# Patient Record
Sex: Male | Born: 1980 | Race: Black or African American | Hispanic: No | Marital: Single | Smoking: Former smoker
Health system: Southern US, Community
[De-identification: ages and names within clinical notes are randomized; demographics above are authoritative.]

## PROBLEM LIST (undated history)

## (undated) DIAGNOSIS — J9383 Other pneumothorax: Secondary | ICD-10-CM

## (undated) DIAGNOSIS — M069 Rheumatoid arthritis, unspecified: Secondary | ICD-10-CM

## (undated) HISTORY — DX: Rheumatoid arthritis, unspecified: M06.9

## (undated) HISTORY — DX: Other pneumothorax: J93.83

---
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
## 2003-10-03 ENCOUNTER — Emergency Department (HOSPITAL_COMMUNITY): Admission: EM | Admit: 2003-10-03 | Discharge: 2003-10-03 | Payer: Self-pay | Admitting: Emergency Medicine

## 2006-10-03 ENCOUNTER — Emergency Department (HOSPITAL_COMMUNITY): Admission: EM | Admit: 2006-10-03 | Discharge: 2006-10-03 | Payer: Self-pay | Admitting: Emergency Medicine

## 2006-11-23 ENCOUNTER — Emergency Department (HOSPITAL_COMMUNITY): Admission: EM | Admit: 2006-11-23 | Discharge: 2006-11-23 | Payer: Self-pay | Admitting: Family Medicine

## 2007-04-02 ENCOUNTER — Emergency Department (HOSPITAL_COMMUNITY): Admission: EM | Admit: 2007-04-02 | Discharge: 2007-04-02 | Payer: Self-pay | Admitting: Emergency Medicine

## 2008-11-28 ENCOUNTER — Emergency Department (HOSPITAL_COMMUNITY): Admission: EM | Admit: 2008-11-28 | Discharge: 2008-11-28 | Payer: Self-pay | Admitting: Family Medicine

## 2009-07-11 ENCOUNTER — Emergency Department (HOSPITAL_COMMUNITY): Admission: EM | Admit: 2009-07-11 | Discharge: 2009-07-11 | Payer: Self-pay | Admitting: Family Medicine

## 2009-10-03 ENCOUNTER — Emergency Department (HOSPITAL_COMMUNITY): Admission: EM | Admit: 2009-10-03 | Discharge: 2009-10-03 | Payer: Self-pay | Admitting: Emergency Medicine

## 2009-10-08 ENCOUNTER — Encounter: Admission: RE | Admit: 2009-10-08 | Discharge: 2009-10-08 | Payer: Self-pay | Admitting: Surgery

## 2009-10-08 ENCOUNTER — Ambulatory Visit: Payer: Self-pay | Admitting: Surgery

## 2010-03-14 ENCOUNTER — Telehealth: Payer: Self-pay | Admitting: *Deleted

## 2010-03-14 NOTE — Telephone Encounter (Signed)
She wants [place] to take her son as a new pt asap as he has chest tightness & hx of collapsed lungs. Sent to ED now. Told her we are not taking new pts at this time. States sh has an appt with a new pcp next month. She wants a pulmonologist. Told her ED could refer him to one if needed. She will get him to ED.Malik KitchenFaustino Congress

## 2010-03-24 ENCOUNTER — Inpatient Hospital Stay (INDEPENDENT_AMBULATORY_CARE_PROVIDER_SITE_OTHER)
Admission: RE | Admit: 2010-03-24 | Discharge: 2010-03-24 | Disposition: A | Discharge status: Home / Self Care | Payer: Medicare Other | Source: Ambulatory Visit | Attending: Family Medicine | Admitting: Family Medicine

## 2010-03-24 DIAGNOSIS — J069 Acute upper respiratory infection, unspecified: Secondary | ICD-10-CM

## 2010-04-04 ENCOUNTER — Institutional Professional Consult (permissible substitution): Payer: Self-pay | Admitting: Internal Medicine

## 2010-04-04 DIAGNOSIS — M069 Rheumatoid arthritis, unspecified: Secondary | ICD-10-CM | POA: Insufficient documentation

## 2010-04-07 ENCOUNTER — Ambulatory Visit (INDEPENDENT_AMBULATORY_CARE_PROVIDER_SITE_OTHER)
Admission: RE | Admit: 2010-04-07 | Discharge: 2010-04-07 | Disposition: A | Discharge status: Home / Self Care | Payer: Medicare Other | Source: Ambulatory Visit | Attending: Internal Medicine | Admitting: Internal Medicine

## 2010-04-07 ENCOUNTER — Encounter: Payer: Self-pay | Admitting: Internal Medicine

## 2010-04-07 ENCOUNTER — Other Ambulatory Visit: Payer: Self-pay | Admitting: Internal Medicine

## 2010-04-07 ENCOUNTER — Institutional Professional Consult (permissible substitution) (INDEPENDENT_AMBULATORY_CARE_PROVIDER_SITE_OTHER): Payer: Medicare Other | Admitting: Internal Medicine

## 2010-04-07 DIAGNOSIS — J449 Chronic obstructive pulmonary disease, unspecified: Secondary | ICD-10-CM

## 2010-04-07 DIAGNOSIS — J4489 Other specified chronic obstructive pulmonary disease: Secondary | ICD-10-CM | POA: Insufficient documentation

## 2010-04-07 DIAGNOSIS — M069 Rheumatoid arthritis, unspecified: Secondary | ICD-10-CM

## 2010-04-07 DIAGNOSIS — J9383 Other pneumothorax: Secondary | ICD-10-CM

## 2010-04-17 NOTE — Assessment & Plan Note (Signed)
Summary: Pulmonary/ new pt eval   Visit Type:  Initial Consult Copy to:  Dr. Azzie Roup Primary Provider/Referring Provider:  none  CC:  DOE.  History of Present Illness: 45 yobm  with asthma as child  never outgrew the need for rescue inahler quit smoking x 2010 with JRA since age 30  then new sob since sept 2012 with R PTX > never required chest tube   April 07, 2010  1st pulmonary office eval for doe x Sept 2012  with recurrent ptx never required a chest tube or admit but felt it happen again 6 weeks prior to ov  and by day of ov feel better, using IS only.  no pulmonary meds, feels his IS  "machine" is all he needs.  doe x > fast walk or up hills. no noct exac or early am flare of symptoms, minimal dry cough.  Presently Pt denies any significant oongoing  sore throat, dysphagia, itching, sneezing,  nasal congestion or excess secretions,  fever, chills, sweats, unintended wt loss, pleuritic or exertional cp, hempoptysis, change in activity tolerance  orthopnea pnd or leg swelling Pt also denies any obvious fluctuation in symptoms with weather or environmental change or other alleviating or aggravating factors.       Current Medications (verified): 1)  Hydrocodone-Acetaminophen 10-650 Mg Tabs (Hydrocodone-Acetaminophen) .Marland Kitchen.. 1 Three Times A Day As Needed For Pain 2)  Methotrexate 2.5 Mg Tabs (Methotrexate Sodium) .... 3 Tablets Wkly 3)  Meloxicam 15 Mg Tabs (Meloxicam) .Marland Kitchen.. 1 Once Daily 4)  Plaquenil 200 Mg Tabs (Hydroxychloroquine Sulfate) .Marland Kitchen.. 1 Once Daily  Allergies (verified): 1)  ! Sulfa  Past History:  Past Medical History: Rheumatoid Arthritis Recurrent PTX onset Sept 2010     - Alpha one AT screen April 07, 2010 >>  Family History: Negative for respiratory diseases or atopy   Social History: Single Former smoker. Quit in 2010.  Smoked cigars occ for a few yrs. No ETOH Lives alone Works PT at a [employer]  Review of Systems       The patient complains of  shortness of breath with activity, chest pain, loss of appetite, and sore throat.  The patient denies shortness of breath at rest, productive cough, non-productive cough, coughing up blood, irregular heartbeats, acid heartburn, indigestion, weight change, abdominal pain, difficulty swallowing, tooth/dental problems, headaches, nasal congestion/difficulty breathing through nose, sneezing, itching, ear ache, anxiety, depression, hand/feet swelling, joint stiffness or pain, rash, change in color of mucus, and fever.    Vital Signs:  Patient profile:   30 year old male Height:      70 inches Weight:      129 pounds BMI:     18.58 O2 Sat:      98 % on Room air Temp:     97.4 degrees F oral Pulse rate:   88 / minute BP sitting:   110 / 70  (left arm)  Vitals Entered By: Vernie Murders (April 07, 2010 10:51 AM)  O2 Flow:  Room air  Physical Exam  Additional Exam:  thin amb bm nad with classic ra changes both hands wt 129 April 07, 2010  HEENT mild turbinate edema.  Oropharynx no thrush or excess pnd or cobblestoning.  No JVD or cervical adenopathy. Mild accessory muscle hypertrophy. Trachea midline, nl thryroid. Chest was hyperinflated by percussion with diminished breath sounds and moderate increased exp time without wheeze. Hoover sign positive at mid inspiration. Regular rate and rhythm without murmur gallop or rub or  increase P2 or edema.  Abd: no hsm, nl excursion. Ext warm without cyanosis or clubbing.     CXR  Procedure date:  04/07/2010  Findings:       Comparison: Chest radiograph 10/08/2009, 10/03/2009 and 11/28/2008.  Findings: There is no pneumothorax.  Biapical scarring is stable. Lung volumes are normal.  The lungs are clear.  Heart, mediastinal, and hilar contours are stable.  No acute bony abnormality.  IMPRESSION: Stable chest radiograph.  No acute finding  Impression & Recommendations:  Problem # 1:  COPD UNSPECIFIED (ICD-496)   DDX of  difficult airways  managment all start with A and  include Adherence, Ace Inhibitors, Acid Reflux, Active Sinus Disease, Alpha 1 Antitripsin deficiency, Anxiety masquerading as Airways dz,  ABPA,  allergy(esp in young), Aspiration (esp in elderly), Adverse effects of DPI,  Active smokers, plus two Bs  = Bronchiectasis and Beta blocker use..and one C= CHF    Alpha one needs to be excluded here  Anixety always dx of exclusion  Needs pft's for baseline  Problem # 2:  Hx of OTHER PNEUMOTHORAX (ICD-512.89)   discussed risk of recurrence/ tension.  lives nearby, no barometric pressure swing issues in terms of occupation or hobbies  Problem # 3:  RHEUMATOID ARTHRITIS (ICD-714.0) Risk of  RA related BO,  pft's will be impt here as well as avoiding all cig exposures  Medications Added to Medication List This Visit: 1)  Hydrocodone-acetaminophen 10-650 Mg Tabs (Hydrocodone-acetaminophen) .Marland Kitchen.. 1 three times a day as needed for pain 2)  Methotrexate 2.5 Mg Tabs (Methotrexate sodium) .... 3 tablets wkly 3)  Meloxicam 15 Mg Tabs (Meloxicam) .Marland Kitchen.. 1 once daily 4)  Plaquenil 200 Mg Tabs (Hydroxychloroquine sulfate) .Marland Kitchen.. 1 once daily  Other Orders: T-2 View CXR (71020TC) New Patient Level V (14782)  Patient Instructions: 1)  Please schedule a follow-up appointment in 6 weeks, sooner if needed with pft's

## 2010-04-26 ENCOUNTER — Emergency Department (HOSPITAL_COMMUNITY)
Admission: EM | Admit: 2010-04-26 | Discharge: 2010-04-26 | Disposition: A | Discharge status: Home / Self Care | Payer: Medicare Other | Attending: Emergency Medicine | Admitting: Emergency Medicine

## 2010-04-26 ENCOUNTER — Emergency Department (HOSPITAL_COMMUNITY): Payer: Medicare Other

## 2010-04-26 DIAGNOSIS — J45909 Unspecified asthma, uncomplicated: Secondary | ICD-10-CM | POA: Insufficient documentation

## 2010-04-26 DIAGNOSIS — R079 Chest pain, unspecified: Secondary | ICD-10-CM | POA: Insufficient documentation

## 2010-04-26 DIAGNOSIS — R0602 Shortness of breath: Secondary | ICD-10-CM | POA: Insufficient documentation

## 2010-04-26 DIAGNOSIS — M083 Juvenile rheumatoid polyarthritis (seronegative): Secondary | ICD-10-CM | POA: Insufficient documentation

## 2010-04-26 DIAGNOSIS — S270XXA Traumatic pneumothorax, initial encounter: Secondary | ICD-10-CM | POA: Insufficient documentation

## 2010-04-28 ENCOUNTER — Emergency Department (HOSPITAL_COMMUNITY): Payer: Medicare Other

## 2010-04-28 ENCOUNTER — Emergency Department (HOSPITAL_COMMUNITY)
Admission: EM | Admit: 2010-04-28 | Discharge: 2010-04-28 | Disposition: A | Discharge status: Home / Self Care | Payer: Medicare Other | Attending: Emergency Medicine | Admitting: Emergency Medicine

## 2010-04-28 DIAGNOSIS — M083 Juvenile rheumatoid polyarthritis (seronegative): Secondary | ICD-10-CM | POA: Insufficient documentation

## 2010-04-28 DIAGNOSIS — Z09 Encounter for follow-up examination after completed treatment for conditions other than malignant neoplasm: Secondary | ICD-10-CM | POA: Insufficient documentation

## 2010-04-28 DIAGNOSIS — J9383 Other pneumothorax: Secondary | ICD-10-CM | POA: Insufficient documentation

## 2010-04-28 DIAGNOSIS — Z79899 Other long term (current) drug therapy: Secondary | ICD-10-CM | POA: Insufficient documentation

## 2010-04-28 DIAGNOSIS — J45909 Unspecified asthma, uncomplicated: Secondary | ICD-10-CM | POA: Insufficient documentation

## 2010-04-29 ENCOUNTER — Other Ambulatory Visit: Payer: Self-pay | Admitting: Cardiothoracic Surgery

## 2010-04-29 DIAGNOSIS — J930 Spontaneous tension pneumothorax: Secondary | ICD-10-CM

## 2010-04-30 ENCOUNTER — Ambulatory Visit: Payer: Medicare Other | Admitting: Cardiothoracic Surgery

## 2010-05-02 ENCOUNTER — Other Ambulatory Visit: Payer: Self-pay | Admitting: Cardiothoracic Surgery

## 2010-05-02 DIAGNOSIS — J930 Spontaneous tension pneumothorax: Secondary | ICD-10-CM

## 2010-05-05 ENCOUNTER — Ambulatory Visit
Admission: RE | Admit: 2010-05-05 | Discharge: 2010-05-05 | Disposition: A | Discharge status: Home / Self Care | Payer: Medicare Other | Source: Ambulatory Visit | Attending: Cardiothoracic Surgery | Admitting: Cardiothoracic Surgery

## 2010-05-05 ENCOUNTER — Ambulatory Visit (INDEPENDENT_AMBULATORY_CARE_PROVIDER_SITE_OTHER): Payer: Medicare Other

## 2010-05-05 DIAGNOSIS — J930 Spontaneous tension pneumothorax: Secondary | ICD-10-CM

## 2010-05-16 ENCOUNTER — Encounter: Payer: Self-pay | Admitting: Internal Medicine

## 2010-05-19 ENCOUNTER — Ambulatory Visit (INDEPENDENT_AMBULATORY_CARE_PROVIDER_SITE_OTHER): Payer: Medicare Other | Admitting: Internal Medicine

## 2010-05-19 ENCOUNTER — Encounter: Payer: Self-pay | Admitting: Internal Medicine

## 2010-05-19 ENCOUNTER — Telehealth: Payer: Self-pay | Admitting: Internal Medicine

## 2010-05-19 VITALS — BP 110/70 | HR 88 | Temp 98.4°F | Ht 70.0 in | Wt 139.0 lb

## 2010-05-19 DIAGNOSIS — J9383 Other pneumothorax: Secondary | ICD-10-CM

## 2010-05-19 DIAGNOSIS — J45909 Unspecified asthma, uncomplicated: Secondary | ICD-10-CM | POA: Insufficient documentation

## 2010-05-19 DIAGNOSIS — J449 Chronic obstructive pulmonary disease, unspecified: Secondary | ICD-10-CM

## 2010-05-19 LAB — PULMONARY FUNCTION TEST

## 2010-05-19 MED ORDER — MOMETASONE FURO-FORMOTEROL FUM 100-5 MCG/ACT IN AERO: INHALATION_SPRAY | RESPIRATORY_TRACT | Status: DC

## 2010-05-19 NOTE — Assessment & Plan Note (Signed)
See rx for ? Asthma to reduce exac

## 2010-05-19 NOTE — Progress Notes (Signed)
PFT done today. 

## 2010-05-19 NOTE — Progress Notes (Signed)
  Subjective:    Patient ID: Malik Barker, male    DOB: 1980-08-15, 30 y.o.   MRN: [id number]  HPI  Primary Malik Barker  29 yobm quit smoking 2001  with asthma as child  never outgrew the need for rescue inahler quit smoking x 2010 with JRA since age 89  then new sob since sept 2011 with R PTX > never required chest tube   April 07, 2010  1st pulmonary office eval for doe x Sept 2012  with recurrent ptx never required a chest tube or admit but felt it happen again 6 weeks prior to ov  and by day of ov feel better, using IS only.  no pulmonary meds, feels his IS  "machine" is all he needs.  doe x > fast walk or up hills. no noct exac or early am flare of symptoms, minimal dry cough.   05/19/2010 ov/ Malik Barker  4th episode of ptx over Easter Saturday eval by Malik Barker and no need for Chest tube, back to nl.   Uses saba w/in past week feels better p rx but note Sleeping ok without nocturnal  or early am exac of resp c/o's or need for noct saba.  No purulent sputum or sob  Pt denies any significant sore throat, dysphagia, itching, sneezing,  nasal congestion or excess/ purulent secretions,  fever, chills, sweats, unintended wt loss, pleuritic or exertional cp, hempoptysis, orthopnea pnd or leg swelling.    Also denies any obvious fluctuation of symptoms with weather or environmental changes or other aggravating or alleviating factors.         Allergies  1)  ! Sulfa    Past Medical History: Rheumatoid Arthritis Recurrent PTX onset Sept 2010     - Alpha one AT screen April 07, 2010 >> neg     - PFT's 05/19/2010 nl x mid flows, subj better on saba > dulera trial @ 100 2bid initiated   Family History: Negative for respiratory diseases or atopy   Social History: Single Former smoker. Quit in 2010.  Smoked cigars occ for a few yrs. No ETOH Lives alone Works PT at a [employer]              Review of Systems     Objective:   Physical Exam    thin amb bm nad with classic ra  changes both hands wt 129 April 07, 2010  >  139 05/19/2010  HEENT mild turbinate edema.  Oropharynx no thrush or excess pnd or cobblestoning.  No JVD or cervical adenopathy. Mild accessory muscle hypertrophy. Trachea midline, nl thryroid. Chest was hyperinflated by percussion with diminished breath sounds and moderate increased exp time without wheeze. Hoover sign positive at mid inspiration. Regular rate and rhythm without murmur gallop or rub or increase P2 or edema.  Abd: no hsm, nl excursion. Ext warm without cyanosis or clubbing.       Assessment & Plan:

## 2010-05-19 NOTE — Assessment & Plan Note (Addendum)
Clinical hx suggests asthma with nl baseline pft's but this may be contributing to recurrent ptx so will go ahead and try dulera to see if it eliminates all symptoms including tendency to recurrent ptx  Discussed in detail all the  indications, usual  risks and alternatives  relative to the benefits with patient who agrees to proceed with  Trial of dulera with purpose to reduce use of saba and recurrence rate for ptx   The proper method of use, as well as anticipated side effects, of this metered-dose inhaler are discussed and demonstrated to the patient. Pt improved to 75% with coaching

## 2010-05-19 NOTE — Telephone Encounter (Signed)
ATC pt to inform Alpha 1 screen was neg.  NA and msg states that VM box is full so unable to leave a msg. WCB.

## 2010-05-19 NOTE — Patient Instructions (Signed)
Start dulera 100 Take 2 puffs first thing in am   Call Dr Zenaida Niece Tright if bad chest pain recurs or go to ER.  Please schedule a follow up office visit in 8  weeks, sooner if needed

## 2010-05-30 ENCOUNTER — Encounter: Payer: Self-pay | Admitting: Internal Medicine

## 2010-06-03 NOTE — Telephone Encounter (Signed)
Spoke with pt and notified of results per Dr. Wert. Pt verbalized understanding and denied any questions. 

## 2010-06-03 NOTE — Consult Note (Signed)
NEW PATIENT CONSULTATION   [place], [facility]  DOB:  1980/10/04                                        October 08, 2009  CHART #:  [id number]   HISTORY:  The patient is a 30 year old smoker who was seen in the  emergency room at [facility] complaining of right-sided chest pain.  He  was diagnosed with a small right apical spontaneous pneumothorax.  A  followup chest x-ray was performed a few hours later and it was  unchanged.  The patient was sent home with followup in our office for  further consultation.   PAST MEDICAL HISTORY:  Significant only for history of asthma.  He has  had no prior surgery.   SOCIAL HISTORY:  He is unemployed and single.  He said he quit smoking,  it is not clear when that was.  He denies alcohol or drug use.   FAMILY HISTORY:  Negative.   REVIEW OF SYSTEMS:  Noted on the patient information sheet in the chart.  He said he still gets a little bit of right-sided chest discomfort that  is improving.  He denies any cough or sputum production.   PHYSICAL EXAMINATION:  Vital Signs:  Blood pressure is 115/70, pulse 82  and regular, respiratory rate is 16, unlabored.  Oxygen saturation on  room air is 99%.  General:  He looks well.  Lungs:  Clear.  Cardiac:  Regular rate and rhythm with normal heart sounds.   Followup chest x-ray today shows clear lung fields and no pleural  effusions.  There is no pneumothorax.   IMPRESSION:  The patient has had a first episode of spontaneous right  pneumothorax that is completely resolved on chest x-ray.  He still has a  little bit of chest discomfort but this is improving.  I do not think he  requires any further treatment.  I advised him against smoking and also  advised him against scuba diving or flying in an airplane over the next  3 months.  I told him hey really should not be doing any scuba diving in  the future, and he said that he has not planning on doing that.  I  discussed the  pathophysiology of spontaneous pneumothorax and told him  there is about 20% risk of this may recur over the next 1-2 years.  He  will contact at our office if he has recurrent symptoms.  I also advised  him against doing any activity that requires heavy straining such as  weight lifting or playing foreign musical instruments.  He said he  understands and will contact us if he develops any recurrent symptoms.   Evelene Croon, M.D.  Electronically Signed   BB/MEDQ  D:  10/08/2009  T:  10/09/2009  Job:  [id number]

## 2010-07-29 NOTE — Assessment & Plan Note (Signed)
OFFICE VISIT  Malik Barker, Malik Barker [facility] DOB:  24-Nov-1980                                        May 05, 2010 CHART #:  [id number]  HISTORY OF PRESENT ILLNESS:  Malik Barker is a 30 year old male who is known to our office from a previous visit in September 2011.  He was seen at that time by Dr. Laneta Simmers for followup of small right apical spontaneous pneumothorax which had been seen in the emergency department.  Upon his visit, the pneumothorax had resolved and Dr. Laneta Simmers did not feel that he would require any further treatment at this time.  Since then, he has remained relatively stable until approximately 3 weeks ago at which time he presented to the emergency department at [facility] complaining of acute onset dyspnea and right-sided chest discomfort.  He was noted to have again a 10% pneumothorax which was monitored.  He returned after 48 hours for further observation and his x-ray had remained stable.  He is asked to follow up with our office and he comes in today for recheck. He states that his shortness of breath has completely resolved, however, he continues to have significant discomfort particularly in his right back around his shoulder blade.  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 Phylliss Bob and currently is being followed by Dr. Dareen Piano.  He was most recently seen just prior to his being seen in the emergency department and he takes meloxicam and methotrexate regularly.  Additionally, they recently started him on a 10-week course of prednisone for his RA.  He states that the anterior chest discomfort is improved, but the pain is worse with moving around or lifting his arm.  PHYSICAL EXAMINATION:  VITAL SIGNS:  Blood pressure 127/73, respirations 80 and unlabored, and O2 sat 100% on room air. HEART:  Regular rate and rhythm.  No murmurs, rubs, or gallops. LUNGS:  Clear to auscultation. MUSCULOSKELETAL:  He has  significant muscular tenderness along the right scapula and no limitation of range of motion.  On chest x-ray, his right pneumothorax has almost completely resolved with only a tiny apical residual pneumothorax.  ASSESSMENT/PLAN:  Malik Barker returns today for followup of a spontaneous 10% right pneumothorax which was seen in the emergency department.  At this point, his pneumothorax is resolved.  He will not need further treatment from this standpoint.  It is unclear the etiology of the discomfort he is experiencing.  I am sure there is probably a pleuritic component as well as some inflammatory component.  He already takes an antiinflammatory as well as a steroid on a regular basis.  He does appear to have significant muscle tenderness and spasm over this area, possibly from guarding and limiting his movement.  I have given him a prescription for Robaxin to take for 1 week and asked him to apply heat to the area to see if we can decrease his muscle spasm.  We will not need to see him back on a regular basis for followup, but he should follow up in the next week and half as already scheduled with Dr. Durenda Age for further evaluation of his RA.  We will be happy to see him as needed in the future if he experiences a recurrence.  I encouraged him to continue limitation of very strenuous activity.  We will  see him back if he has any further problems which may be related to it.  Malik Barker, P.A.  GC/MEDQ  D:  05/05/2010  T:  05/06/2010  Job:  [id number]  cc:   Lurena Nida, MD [facility] Office

## 2010-10-13 LAB — POCT RAPID STREP A: Streptococcus, Group A Screen (Direct): NEGATIVE

## 2010-10-31 LAB — POCT RAPID STREP A: Streptococcus, Group A Screen (Direct): NEGATIVE

## 2010-12-09 ENCOUNTER — Emergency Department (INDEPENDENT_AMBULATORY_CARE_PROVIDER_SITE_OTHER)
Admission: EM | Admit: 2010-12-09 | Discharge: 2010-12-09 | Disposition: A | Discharge status: Home / Self Care | Payer: Medicare Other | Source: Home / Self Care | Attending: Emergency Medicine | Admitting: Emergency Medicine

## 2010-12-09 ENCOUNTER — Encounter (HOSPITAL_COMMUNITY): Payer: Self-pay | Admitting: *Deleted

## 2010-12-09 ENCOUNTER — Emergency Department (INDEPENDENT_AMBULATORY_CARE_PROVIDER_SITE_OTHER): Payer: Medicare Other

## 2010-12-09 DIAGNOSIS — J939 Pneumothorax, unspecified: Secondary | ICD-10-CM

## 2010-12-09 DIAGNOSIS — J9383 Other pneumothorax: Secondary | ICD-10-CM

## 2010-12-09 NOTE — ED Notes (Signed)
Pt reports h/o lung problems and has been seeing Dr Sherene Sires for pulmonary work-ups  4 days ago he felt pain in the right anterior chest with slight SOB

## 2010-12-09 NOTE — ED Provider Notes (Addendum)
History     CSN: [id number] Arrival date & time: 12/09/2010 11:30 AM   First MD Initiated Contact with Patient 12/09/10 [phone]      Chief Complaint  Patient presents with  . URI    (Consider location/radiation/quality/duration/timing/severity/associated sxs/prior treatment) HPI Comments: 4 days ago, felt severe pain on my R side "felt like my lung collapsed",, No i did not sick medical attention" "they never know what is wrong with me?!!  What is this [facility] your talking about, i do have my nebulizer machine at home, "felt SOB .cough vomited x 1". It does hurt still (points to R anterior chest region)   (Patient comfortably sitting-full sentences found in room supine-resting with cel phone in hand) No respiratory distress.  Patient is a 30 y.o. male presenting with URI. The history is provided by the patient and a relative.  URI The primary symptoms include cough, nausea and vomiting. Primary symptoms do not include fever, fatigue or rash. The current episode started 3 to 5 days ago. This is a chronic problem.    Past Medical History  Diagnosis Date  . Rheumatoid arthritis   . Recurrent spontaneous pneumothorax     Past Surgical History  Procedure Date  . Total hip arthroplasty 2001    right    No family history on file.  History  Substance Use Topics  . Smoking status: Former Smoker -- 1.0 packs/day for 1 years    Types: Cigars    Quit date: 01/20/1999  . Smokeless tobacco: Never Used  . Alcohol Use: Yes     rare      Review of Systems  Constitutional: Negative for fever and fatigue.  Respiratory: Positive for cough, chest tightness and shortness of breath.   Gastrointestinal: Positive for nausea and vomiting.  Skin: Negative for rash.  Neurological: Negative for dizziness.    Allergies  Sulfonamide derivatives  Home Medications   Current Outpatient Rx  Name Route Sig Dispense Refill  . HYDROCODONE-ACETAMINOPHEN 10-650 MG PO TABS Oral Take 1 tablet by  mouth every 8 (eight) hours as needed.      Marland Kitchen HYDROXYCHLOROQUINE SULFATE 200 MG PO TABS Oral Take 200 mg by mouth daily.      . MELOXICAM 15 MG PO TABS Oral Take 15 mg by mouth daily.      Marland Kitchen PROAIR HFA 108 (90 BASE) MCG/ACT IN AERS Oral Take 2 sprays by mouth Every 6 hours as needed.    . METHOTREXATE 2.5 MG PO TABS  3 tablets once daily     . MOMETASONE FURO-FORMOTEROL FUM 100-5 MCG/ACT IN AERO  Take 2 puffs first thing in am      BP 130/80  Pulse 93  Temp(Src) 97.9 F (36.6 C) (Oral)  Resp 16  SpO2 100%  Physical Exam  Nursing note and vitals reviewed. Constitutional: He appears well-developed and well-nourished. No distress.  Pulmonary/Chest: Effort normal. No respiratory distress. He has decreased breath sounds in the right upper field. He has no wheezes. He has no rales. He exhibits no tenderness.  Abdominal: Soft.  Skin: Skin is intact. There is erythema.    ED Course  Procedures (including critical care time)  Labs Reviewed - No data to display No results found.   No diagnosis found.    MDM  Ongoing respiratory-sx-s including recurrent pneumothorax- pulmonologist and PCP working on symptomatic- treatments- suspect poor compliance and treatment adherence- Discussed case with pulmonologist on call, we opted to admit the patient for observation. Patient had things  to do at home opted to sign AMA. We advice him that is pneumothorax could further advance putting his life at risk        Jimmie Molly, MD 12/09/10 1230  Jimmie Molly, MD 12/09/10 1230  Jimmie Molly, MD 12/09/10 [phone]

## 2010-12-15 ENCOUNTER — Inpatient Hospital Stay: Payer: Medicare Other | Admitting: Adult Health

## 2010-12-22 ENCOUNTER — Inpatient Hospital Stay: Payer: Medicare Other | Admitting: Adult Health

## 2011-10-12 ENCOUNTER — Observation Stay (HOSPITAL_COMMUNITY): Payer: Medicare Other

## 2011-10-12 ENCOUNTER — Emergency Department (HOSPITAL_COMMUNITY): Payer: Medicare Other

## 2011-10-12 ENCOUNTER — Observation Stay (HOSPITAL_COMMUNITY)
Admission: EM | Admit: 2011-10-12 | Discharge: 2011-10-13 | Discharge status: Against Medical Advice | Payer: Medicare Other | Attending: Pulmonary Disease | Admitting: Pulmonary Disease

## 2011-10-12 ENCOUNTER — Encounter (HOSPITAL_COMMUNITY): Payer: Self-pay | Admitting: Family Medicine

## 2011-10-12 DIAGNOSIS — R079 Chest pain, unspecified: Secondary | ICD-10-CM | POA: Insufficient documentation

## 2011-10-12 DIAGNOSIS — J9383 Other pneumothorax: Principal | ICD-10-CM | POA: Insufficient documentation

## 2011-10-12 DIAGNOSIS — M069 Rheumatoid arthritis, unspecified: Secondary | ICD-10-CM | POA: Diagnosis present

## 2011-10-12 DIAGNOSIS — J439 Emphysema, unspecified: Secondary | ICD-10-CM | POA: Diagnosis present

## 2011-10-12 DIAGNOSIS — J939 Pneumothorax, unspecified: Secondary | ICD-10-CM

## 2011-10-12 DIAGNOSIS — R0602 Shortness of breath: Secondary | ICD-10-CM | POA: Insufficient documentation

## 2011-10-12 DIAGNOSIS — M083 Juvenile rheumatoid polyarthritis (seronegative): Secondary | ICD-10-CM | POA: Insufficient documentation

## 2011-10-12 MED ORDER — ZOLPIDEM TARTRATE 5 MG PO TABS
10.0000 mg | ORAL_TABLET | Freq: Once | ORAL | Status: DC
  Filled 2011-10-12: qty 2

## 2011-10-12 MED ORDER — ALBUTEROL SULFATE HFA 108 (90 BASE) MCG/ACT IN AERS: 2.0000 | INHALATION_SPRAY | RESPIRATORY_TRACT | Status: DC | PRN

## 2011-10-12 MED ORDER — HYDROCODONE-ACETAMINOPHEN 10-325 MG PO TABS
1.0000 | ORAL_TABLET | Freq: Four times a day (QID) | ORAL | Status: DC | PRN
  Administered 2011-10-12: 1 via ORAL
  Filled 2011-10-12: qty 2

## 2011-10-12 NOTE — ED Provider Notes (Signed)
Pt seen with PA Patient has recurrent right sided PTX 15% and does not require chest tube I spoke to pulmonary who has managed this gentleman previously and will admit for him for OBS Pt is in no distress at this time   Joya Gaskins, MD 10/12/11 1344

## 2011-10-12 NOTE — ED Provider Notes (Signed)
History     CSN: [id number]  Arrival date & time 10/12/11  1150   First MD Initiated Contact with Patient 10/12/11 1215      Chief Complaint  Patient presents with  . Shortness of Breath    (Consider location/radiation/quality/duration/timing/severity/associated sxs/prior treatment) HPI Comments: Patient presents with acute onset of SOB and chest pain that started this morning when he woke up. The chest pain is sharp and located on the right side of his chest. The pain is moderate to severe and made worse with inspiration. He reports having a pneumothorax in the past, which felt just like this. He denies NVD, abdominal pain, headache.     Patient is a 31 y.o. male presenting with shortness of breath.  Shortness of Breath  Associated symptoms include chest pain and shortness of breath.    Past Medical History  Diagnosis Date  . Rheumatoid arthritis   . Recurrent spontaneous pneumothorax     Past Surgical History  Procedure Date  . Total hip arthroplasty 2001    right    History reviewed. No pertinent family history.  History  Substance Use Topics  . Smoking status: Former Smoker -- 1.0 packs/day for 1 years    Types: Cigars    Quit date: 01/20/1999  . Smokeless tobacco: Never Used  . Alcohol Use: Yes     rare      Review of Systems  Respiratory: Positive for shortness of breath.   Cardiovascular: Positive for chest pain.  All other systems reviewed and are negative.    Allergies  Sulfonamide derivatives  Home Medications   Current Outpatient Rx  Name Route Sig Dispense Refill  . HYDROCODONE-ACETAMINOPHEN 10-650 MG PO TABS Oral Take 1 tablet by mouth every 8 (eight) hours as needed.      Marland Kitchen HYDROXYCHLOROQUINE SULFATE 200 MG PO TABS Oral Take 200 mg by mouth daily.      . MELOXICAM 15 MG PO TABS Oral Take 15 mg by mouth daily.      Marland Kitchen METHOTREXATE 2.5 MG PO TABS  3 tablets once daily     . PROAIR HFA 108 (90 BASE) MCG/ACT IN AERS Oral Take 2 sprays by  mouth Every 6 hours as needed. Shortness of breath      BP 111/63  Pulse 66  Temp 98.1 F (36.7 C) (Oral)  Resp 19  SpO2 100%  Physical Exam  Nursing note and vitals reviewed. Constitutional: He is oriented to person, place, and time. He appears well-developed and well-nourished. No distress.  HENT:  Head: Normocephalic and atraumatic.  Mouth/Throat: No oropharyngeal exudate.  Eyes: Conjunctivae normal and EOM are normal. Pupils are equal, round, and reactive to light. No scleral icterus.  Neck: Normal range of motion. Neck supple.  Cardiovascular: Normal rate and regular rhythm.  Exam reveals no gallop and no friction rub.   No murmur heard. Pulmonary/Chest: Effort normal and breath sounds normal. No respiratory distress. He has no wheezes. He has no rales. He exhibits no tenderness.  Musculoskeletal: Normal range of motion.  Neurological: He is alert and oriented to person, place, and time. No cranial nerve deficit. Coordination normal.  Skin: Skin is warm and dry. He is not diaphoretic.  Psychiatric: He has a normal mood and affect. His behavior is normal.    ED Course  Procedures (including critical care time)  Labs Reviewed - No data to display Dg Chest 2 View  10/12/2011  *RADIOLOGY REPORT*  Clinical Data: Shortness of breath, chest pain.  Pneumothorax.  CHEST - 2 VIEW  Comparison: 12/09/2010  Findings: There is a right-sided pneumothorax, likely approximately 15%, similar to prior study.  Mediastinal structures are midline. Mild hyperinflation of the lungs.  Heart is normal size.  No effusions or acute bony abnormality.  IMPRESSION: Recurrent right pneumothorax, approximately 15%, similar to prior study.   Original Report Authenticated By: Cyndie Chime, M.D.      1. Pneumothorax       MDM  2:12 PM Patient will be admitted for pneumothorax. He is currently stable and in no pain.         Emilia Beck, PA-C 10/12/11 1525

## 2011-10-12 NOTE — ED Notes (Addendum)
Spoke with Merry Proud, NP to clarify order for CXR.   She states that pt needs stat CXR d/t concerns that pneumothorax has gotten larger.  Called x-ray to inform that pt does need a CXR.  Informed pt of need for a new CXR.  Pt does not want to be here and wants to know what he has to do to leave.  Asked pt if he would allow CXR to be done so that it can be confirmed if pneumothorax is larger.  Pt agrees to this.

## 2011-10-12 NOTE — ED Notes (Signed)
Pt refusing any IV at this time. Dr. Tyson Alias notified, stated as long as pt understands risks (this RN described to pt risks) pt may go upstairs. RN tina notified.

## 2011-10-12 NOTE — ED Notes (Signed)
Per pt hx of pneumothorax. sts he woke up this am with SOB and pain on he right side of his chest. sts similar to the way he felt prior.

## 2011-10-12 NOTE — ED Notes (Signed)
Pt to XR

## 2011-10-12 NOTE — ED Notes (Signed)
Patient transported to X-ray 

## 2011-10-12 NOTE — H&P (Signed)
Name: Malik Barker MRN: [id number] DOB: [birth year]    LOS: 0  [facility]   History of Present Illness: 31 y/o AAM, continued intermittent smoker, with PMH of JRA and recurrent PTX presented to [facility] ED on 9/23 after waking with sudden onset severe right sided chest pain that "felt like the other times".  ED evaluation demonstrated CXR with approx 15% PTX.  Denied other associated symptoms to include cough, sinus drainage, fevers, chills.  PCCM called to evaluate.     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  Normal PFT's in 2012 and negative A1 antitrypsin.       Tests / Events: 9/23 CT Chest>>>   Past Medical History  Diagnosis Date  . Rheumatoid arthritis   . Recurrent spontaneous pneumothorax     Past Surgical History  Procedure Date  . Total hip arthroplasty 2001    right    Prior to Admission medications   Medication Sig Start Date End Date Taking? Authorizing Provider  HYDROcodone-acetaminophen (LORCET) 10-650 MG per tablet Take 1 tablet by mouth every 8 (eight) hours as needed.     Yes Historical Provider, MD  hydroxychloroquine (PLAQUENIL) 200 MG tablet Take 200 mg by mouth daily.     Yes Historical Provider, MD  meloxicam (MOBIC) 15 MG tablet Take 15 mg by mouth daily.     Yes Historical Provider, MD  methotrexate (RHEUMATREX) 2.5 MG tablet 3 tablets once daily    Yes Historical Provider, MD  PROAIR HFA 108 (90 BASE) MCG/ACT inhaler Take 2 sprays by mouth Every 6 hours as needed. Shortness of breath 03/20/10  Yes Historical Provider, MD    Allergies Allergies  Allergen Reactions  . Sulfonamide Derivatives     REACTION: unknown    Family History History reviewed. No pertinent family history.  Social History  reports that he quit smoking about 12 years ago. His smoking use included Cigars. He has never used smokeless tobacco. He  reports that he drinks alcohol. He reports that he does not use illicit drugs.  Review Of Systems:   Gen: Denies fever, chills, weight change, fatigue, night sweats HEENT: Denies blurred vision, double vision, hearing loss, tinnitus, sinus congestion, rhinorrhea, sore throat, neck stiffness, dysphagia PULM: Denies cough, sputum production, hemoptysis, wheezing.  SEE HPI CV: Denies chest pain, edema, orthopnea, paroxysmal nocturnal dyspnea, palpitations GI: Denies abdominal pain, nausea, vomiting, diarrhea, hematochezia, melena, constipation, change in bowel habits GU: Denies dysuria, hematuria, polyuria, oliguria, urethral discharge Endocrine: Denies hot or cold intolerance, polyuria, polyphagia or appetite change Derm: Denies rash, dry skin, scaling or peeling skin change Heme: Denies easy bruising, bleeding, bleeding gums Neuro: Denies headache, numbness, weakness, slurred speech, loss of memory or consciousness  Vital Signs: Temp:  [98.1 F (36.7 C)] 98.1 F (36.7 C) (09/23 1154) Pulse Rate:  [66-78] 66  (09/23 1330) Resp:  [16-19] 19  (09/23 1330) BP: (111-126)/(63-90) 111/63 mmHg (09/23 1330) SpO2:  [100 %] 100 % (09/23 1330)    Physical Examination: General: wdwn adult male in NAD Neuro:AAOx4, speech clear, MAE CV: s1s2 rrr, no m/r/g PULM: resp's even/non-labored,lungs bilaterally clear on 100% NRB (for PTX, not hypoxic) GI: flat, soft, bsx4 active Extremities: warm/dry, no edema  Labs    CBC No results found for this basename: HGB:3,HCT:3,WBC:3,PLT:3 in the last 168 hours   BMET No results found for this basename: NA:5,K:2,CL:5,CO2:5,GLUCOSE:5,BUN:5,CREATININE:5,CALCIUM:5,MG:5,PHOS:5 in the  last 168 hours  No results found for this basename: INR:5 in the last 168 hours  No results found for this basename: PHART:5,PCO2:5,PCO2ART:5,PO2:5,PO2ART:5,HCO3:5,TCO2:5,O2SAT:5 in the last 168 hours   Radiology: 9/23 CXR>>>15% R sided PTX   Assessment and Plan: Active  Problems:  Rheumatoid arthritis  Other pneumothorax  Acute Pneumothorax Assessment: Recurrent PTX.  This episode is #5, has never required chest tube placement. A1 antitrypsin negative, PFT's negative.  Was previously on asthma medications but now uses PRN.  Plan: -admit for observation -CT to evaluate lung parenchyma  -f/u cxr in am -consider CVTS eval if larger, otherwise follow up with CVTS as outpatient -pain control   Juvenile RA Assessment: not on medications as outpatient.  Was previously followed by Dr. Phylliss Bob.   Plan: -no acute interventions      Best practices / Disposition: -->Code Status:  -->DVT Px: -->GI Px: -->Diet:    Canary Brim, NP-C [facility] Pgr: [phone]  10/12/2011, 2:40 PM    Seen with ACNP above.  Pt examined and database reviewed. I agree with above findings, assessment and plan as reflected in the note above. Depending on CT chest findings, he should probably have TCTS eval prior to discharge. This is his fourth spont PTX in the past couple of yrs, all on the right  Billy Fischer, MD;  PCCM service; [place] [phone]

## 2011-10-12 NOTE — ED Notes (Signed)
Consult provider at bedside

## 2011-10-13 NOTE — Progress Notes (Signed)
Patient not in room at beginning of shift, no belongings found in room

## 2011-10-13 NOTE — Progress Notes (Signed)
Haven't seen patient in room for over an hour now. Called home phone and cell phone to contact patient without success. I'm assuming he has left AMA. MD notified.

## 2011-10-13 NOTE — Progress Notes (Signed)
Called by bedside RN to notify that they are unable to find the patient.  They have attempted calling his cell phone and house phone to no avail.  Seems that he left AMA.  Search continues.  Alyson Reedy, M.D. [facility] Medicine. Pager: [phone]. After hours pager: [phone].

## 2011-10-13 NOTE — Progress Notes (Signed)
Patient seen by nt Bailey Mech- on her way into work) - she stated that this patient was dressed in street clothes and walking out of facility with a woman and small child.  Lelon Mast did not know that the patient was not discharged and therefore did not stop him or ask any questions.

## 2011-10-13 NOTE — Discharge Summary (Signed)
DISCHARGE SUMMARY  DATE OF ADMISSION:  9/23  DATE OF DISCHARGE:  9/24  ADMISSION DIAGNOSES:   Spontaneous right pneumothorax - recurrent   DISCHARGE DIAGNOSES:   Spontaneous right pneumothorax - recurrent   PRESENTATION:   31 yo male with hx of prior R spontaneous pneumothorax presented with R sided chest pain similar to previous pneumothoraces. CXR demonstrated 15% R pneumothorax.     HOSPITAL COURSE:   He was managed conservatively with observation only. A CT chest did not reveal significant blebs. There were minimal  focal emphysematous changes in the left lower lobe thought possibly related to prior inflammation. On the morning following admission, the patient and his belongings could not be found and presumably left without being formally discharge. No follow up was arranged     Billy Fischer, MD;  PCCM service; Mobile [phone]

## 2011-10-13 NOTE — Progress Notes (Signed)
Patient returned my call from one hour ago and stated "I just couldn't stay in that bed"  "I'm sorry, I just needed to come home".  Told the patient that he needs to do follow up and if he gets short of breath to go to the ed.  The patient stated "I;m fine" and ended the conversation.

## 2011-10-14 NOTE — ED Provider Notes (Signed)
Medical screening examination/treatment/procedure(s) were conducted as a shared visit with non-physician practitioner(s) and myself.  I personally evaluated the patient during the encounter  Pt with recurrent PTX but well appearing reports he has never required tube thoracostomy D/w PCCM who will admit.  No need for emergent chest tube at this time  Joya Gaskins, MD 10/14/11 1406

## 2013-11-02 IMAGING — CR DG CHEST 2V
2 series · 2 of 2 positions shown · non-contrast
Comparison: 1333 hours the same day and earlier.

CLINICAL DATA: 30-year-old male with chest pain, pneumothorax.

CHEST - 2 VIEW

[w chest pa]
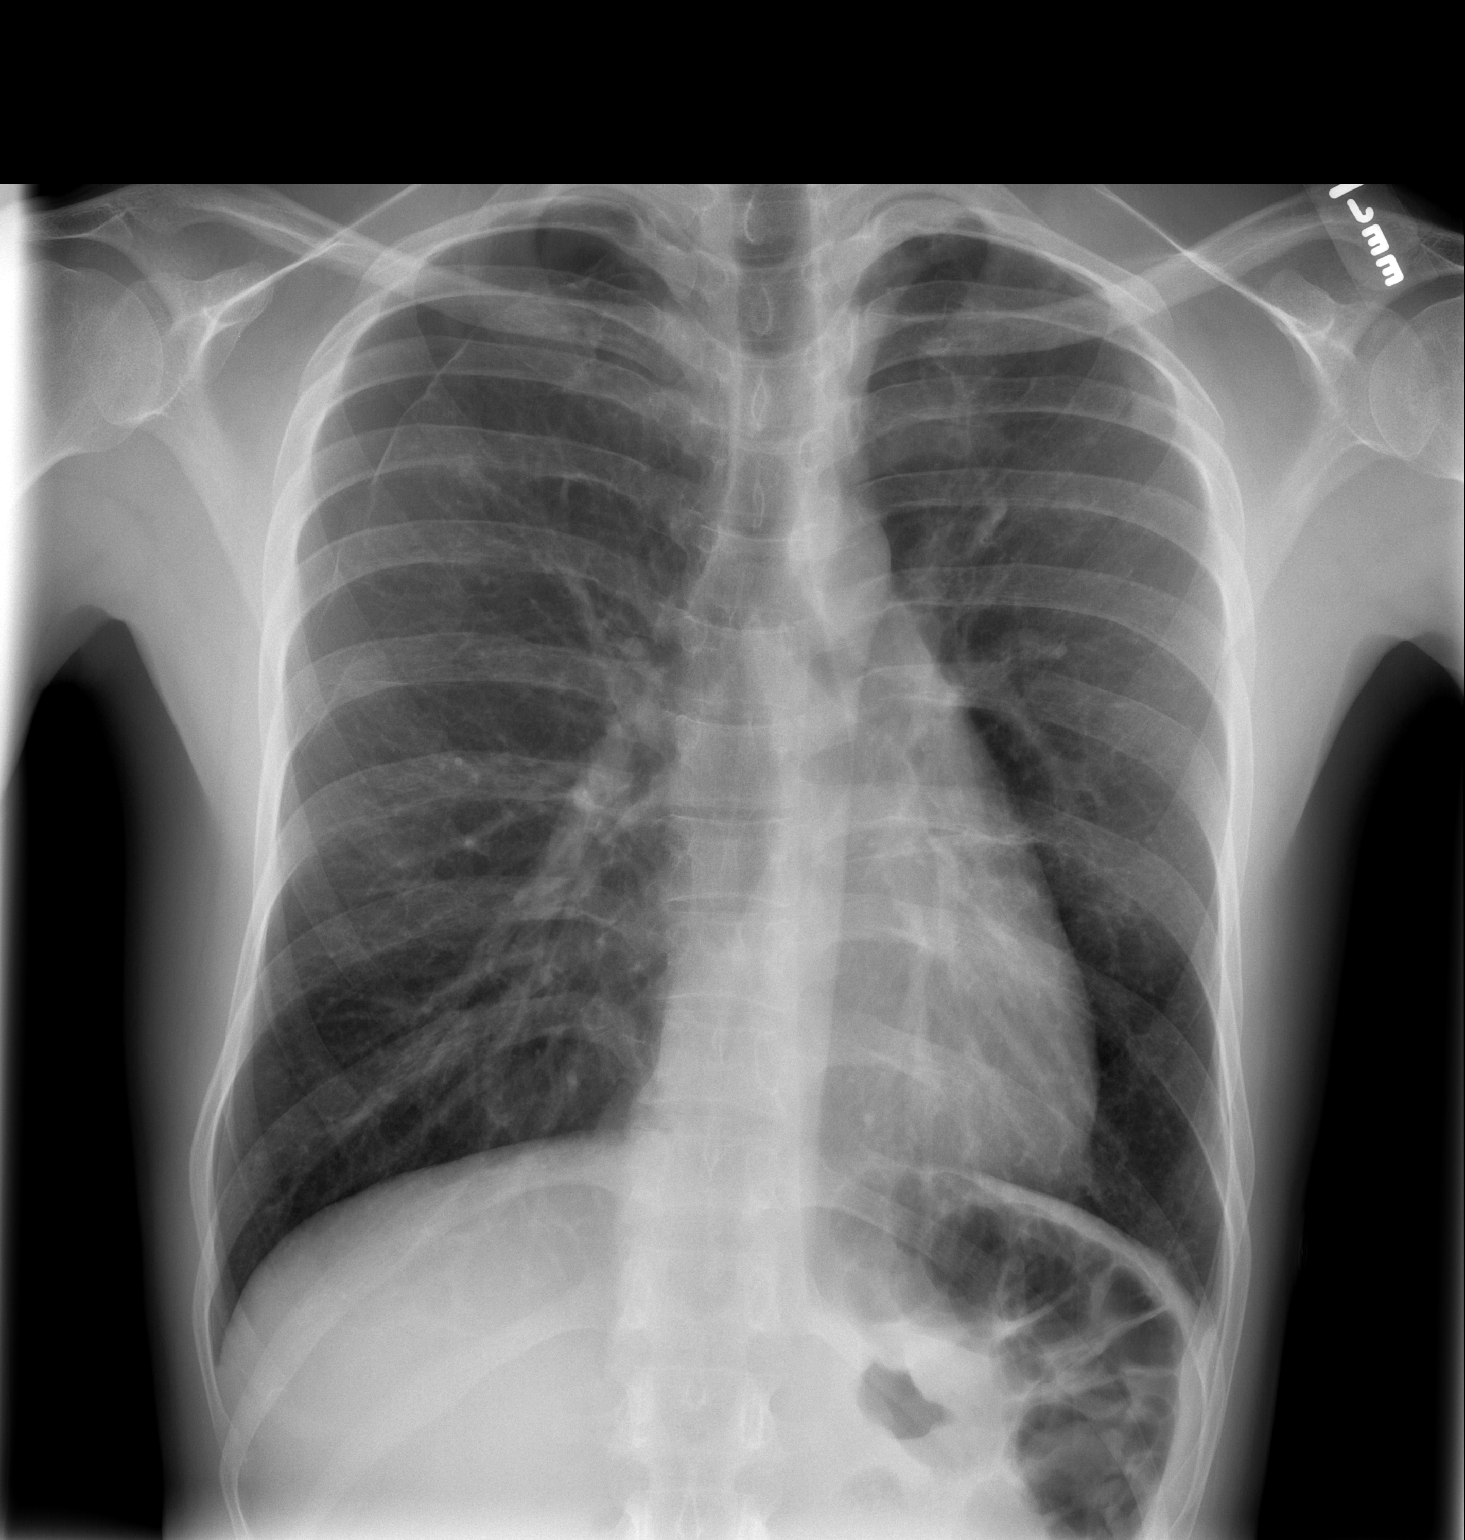

[w chest lat]
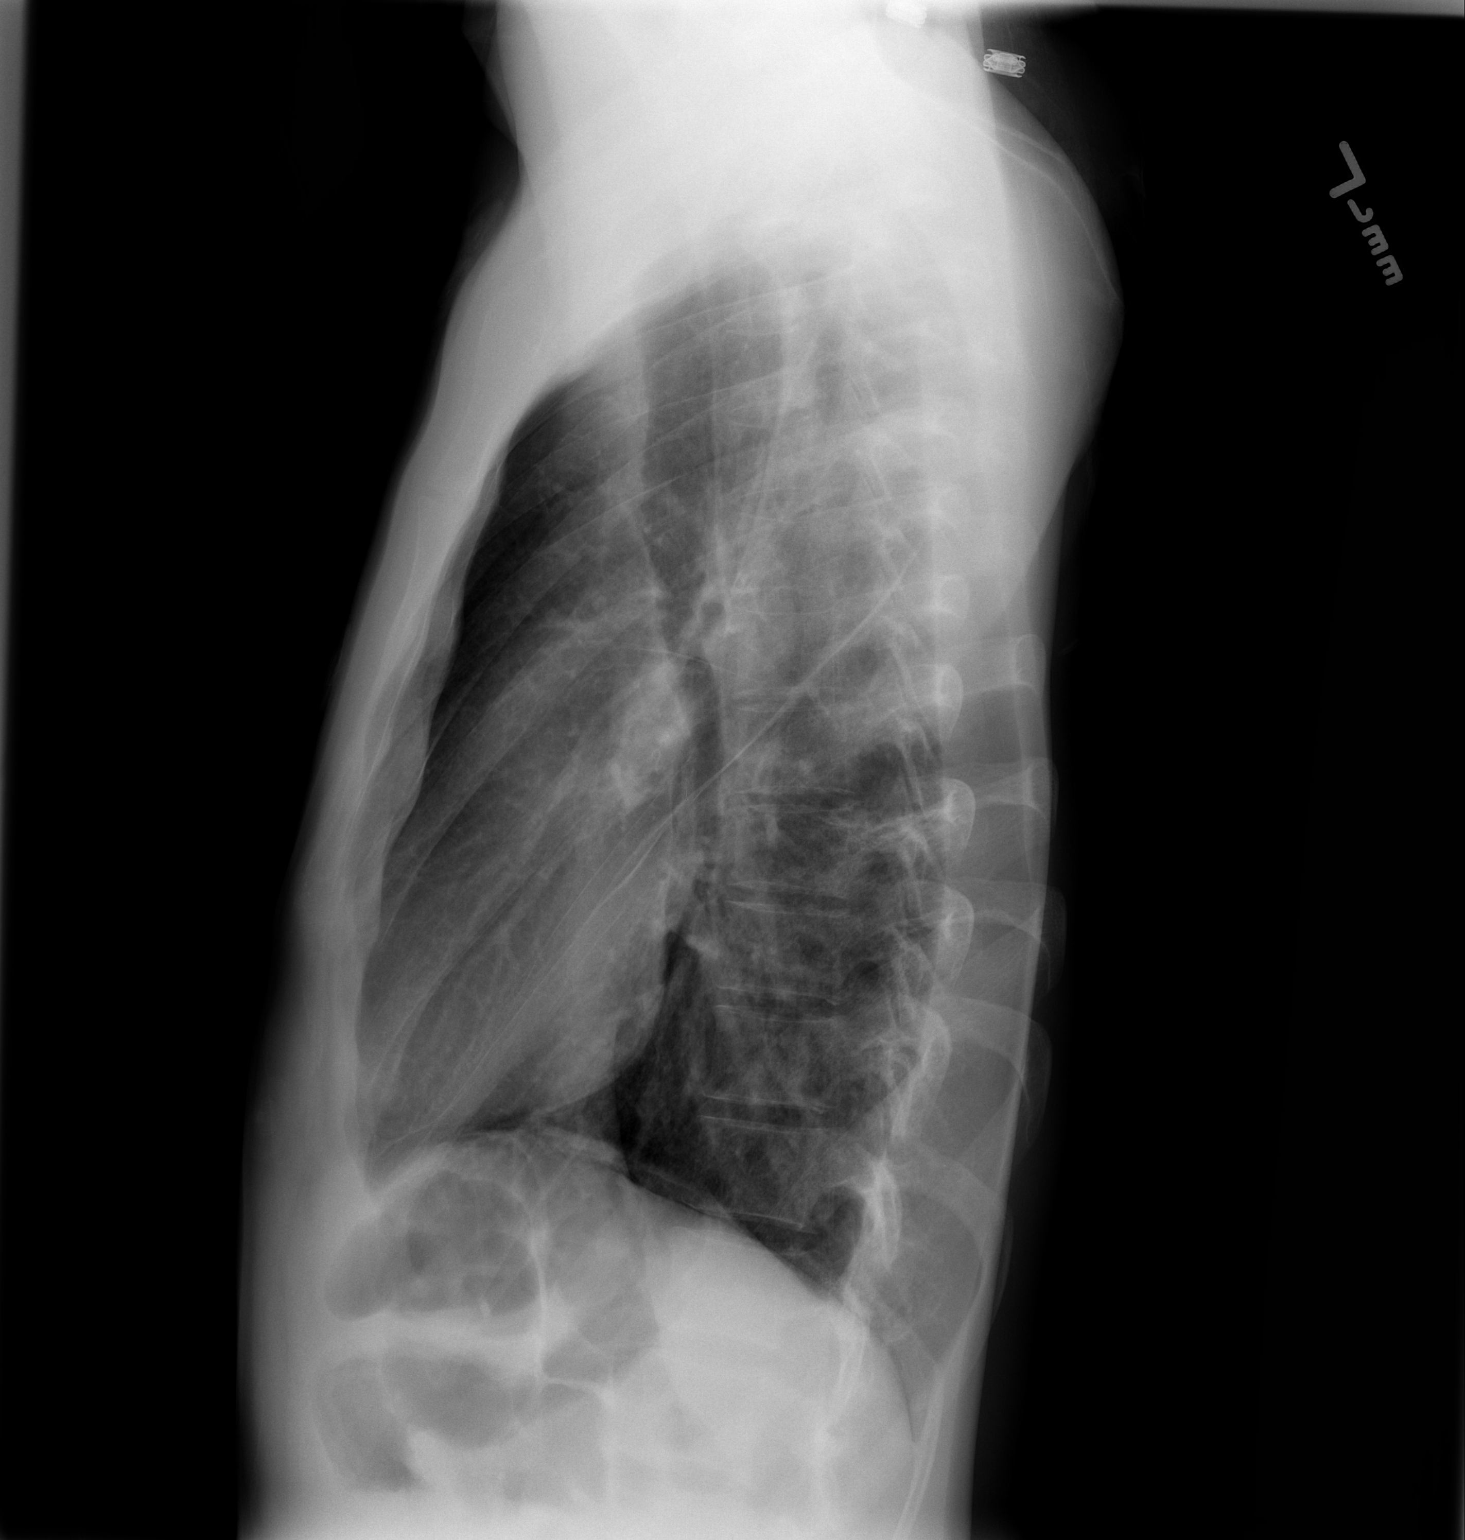

[2 of 2 positions shown; findings below may reference images not displayed]

FINDINGS: Unchanged size and configuration of the right
pneumothorax, a small volume overall.

Stable lung volumes.  Stable cardiac size and mediastinal contours.
Visualized tracheal air column is within normal limits.  No
mediastinal shift.  Stable lung parenchyma. Stable visualized
osseous structures.
IMPRESSION: Unchanged size and configuration of the right pneumothorax since
1333 hours today.

## 2013-11-02 IMAGING — CT CT CHEST W/O CM
2 of 3 series · 15 of 36 positions shown, 18 images · non-contrast
Comparison: Chest radiograph 10/12/2011

***ADDENDUM*** CREATED: 10/12/2011 [DATE]

Report discussed with Arianna Zadeh on 10/12/2011 at [DATE] p.m.
***END ADDENDUM*** SIGNED BY: Lahousin Ra, M.D.
CLINICAL DATA: Shortness of breath and right chest pain. History of
pneumothorax.
CT CHEST WITHOUT CONTRAST
TECHNIQUE: Multidetector CT imaging of the chest was performed
following the standard protocol without IV contrast.

[Series 2: routine chest 5.0 st · axial · 0.70mm/px · z∈[-281,+24]mm · 12 of 73 slices shown, 15 images]
[im 6/73  mediastinal]
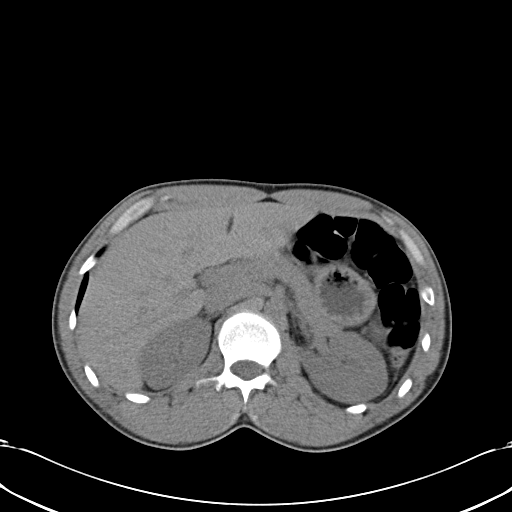
[im 6/73  lung]
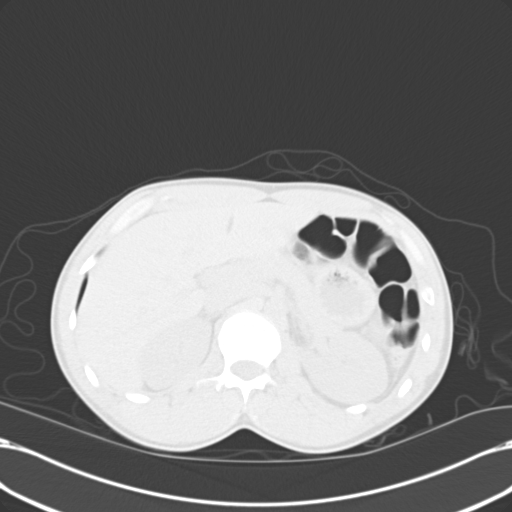
[im 11/73  lung]
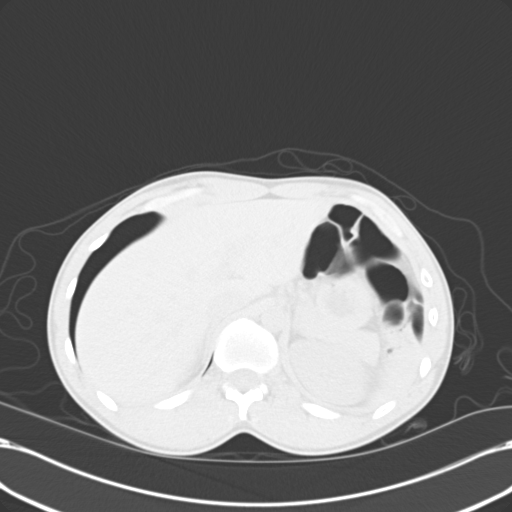
[im 17/73  lung]
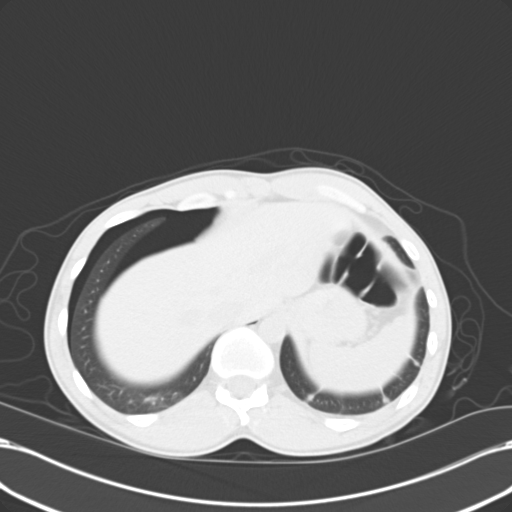
[im 22/73  lung]
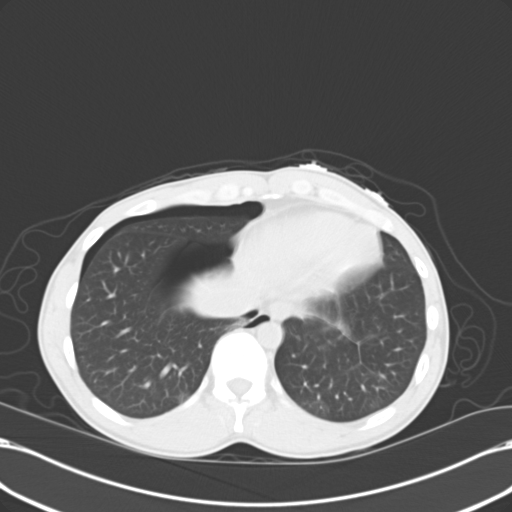
[im 27/73  mediastinal]
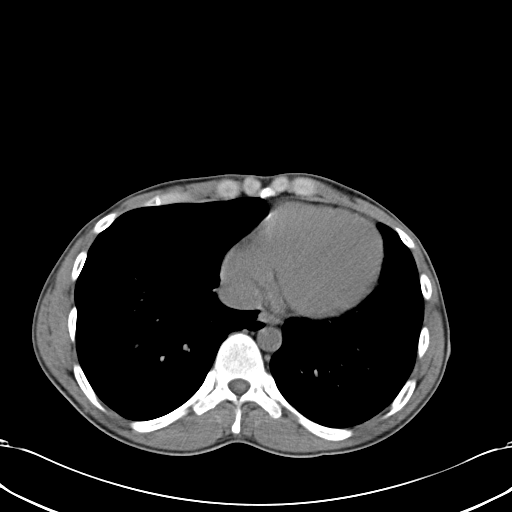
[im 27/73  lung]
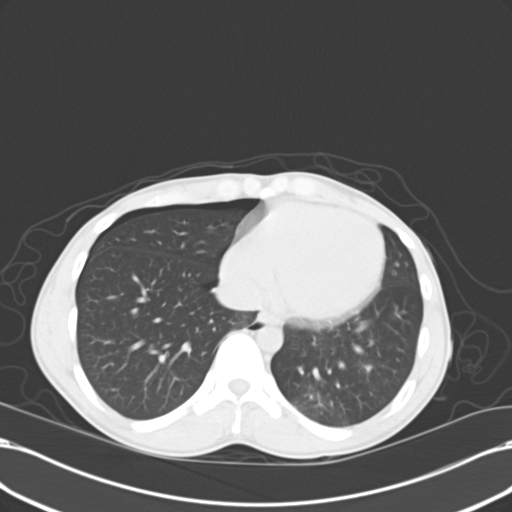
[im 33/73  lung]
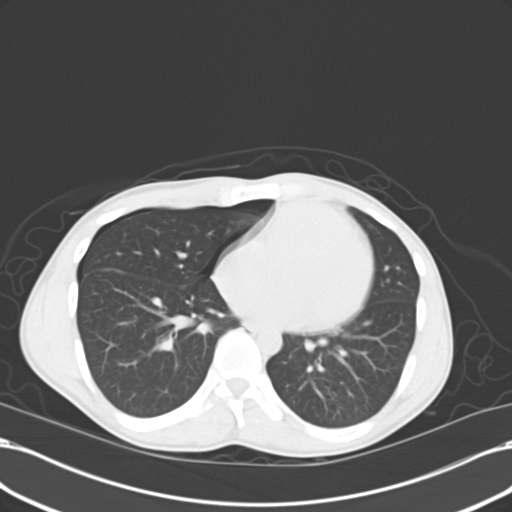
[im 41/73  lung]
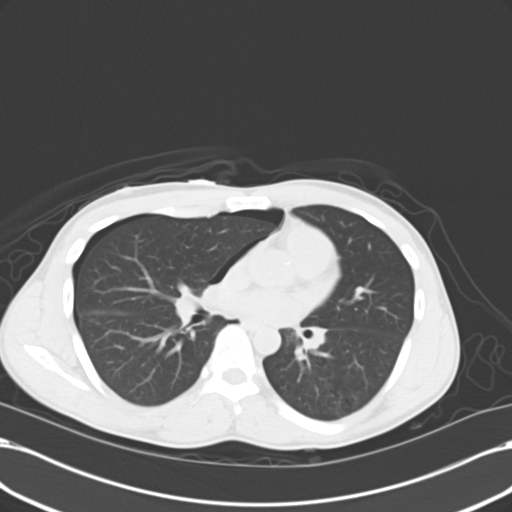
[im 46/73  lung]
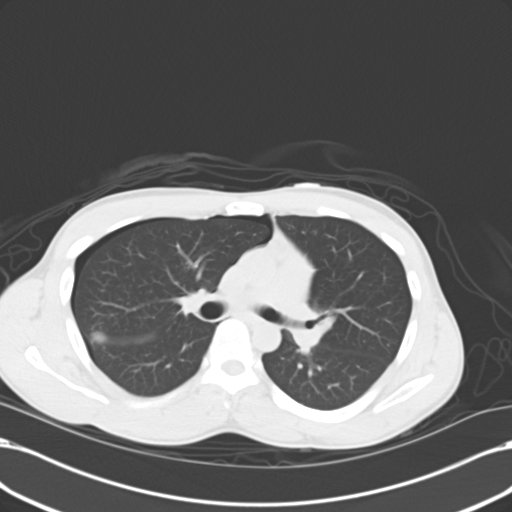
[im 51/73  mediastinal]
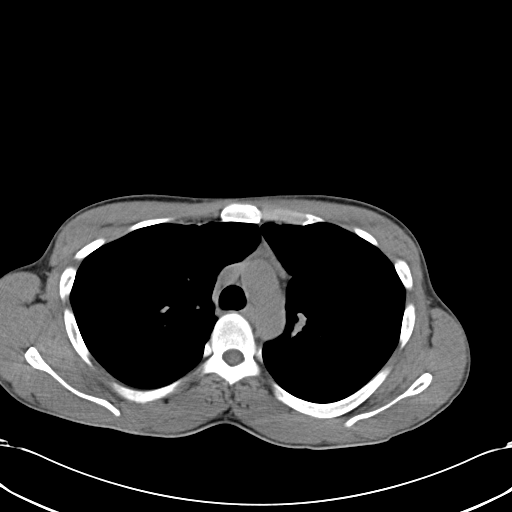
[im 51/73  lung]
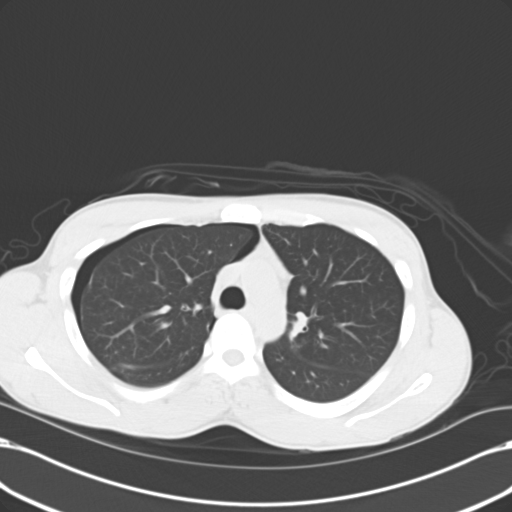
[im 57/73  lung]
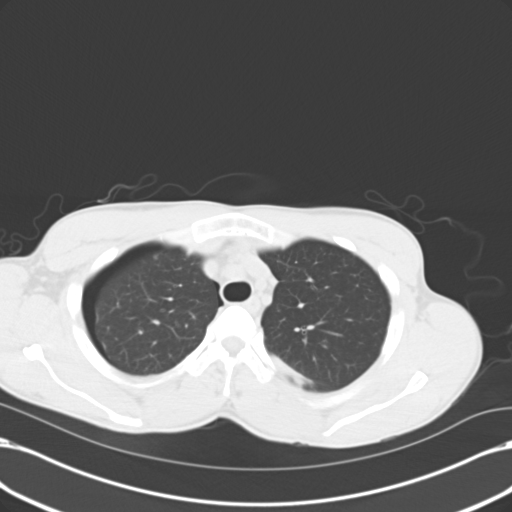
[im 62/73  lung]
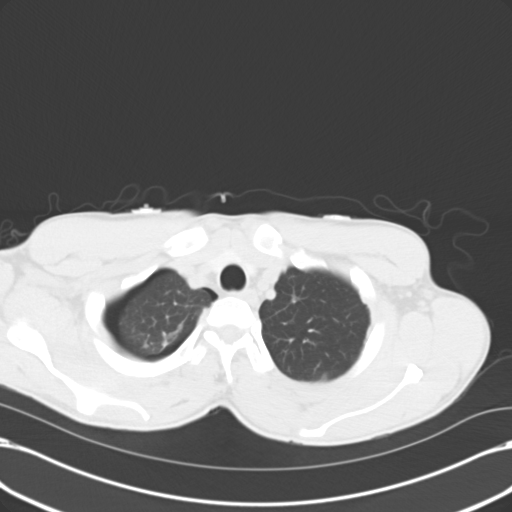
[im 67/73  lung]
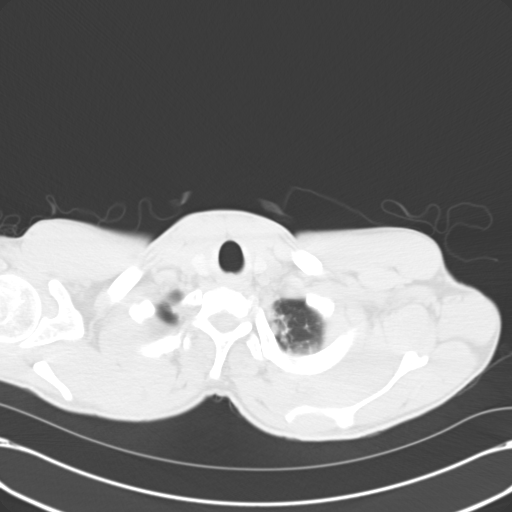

[Series 602: coronal · coronal · 0.71mm/px · 3 of 65 slices shown]
[im 13/65  lung]
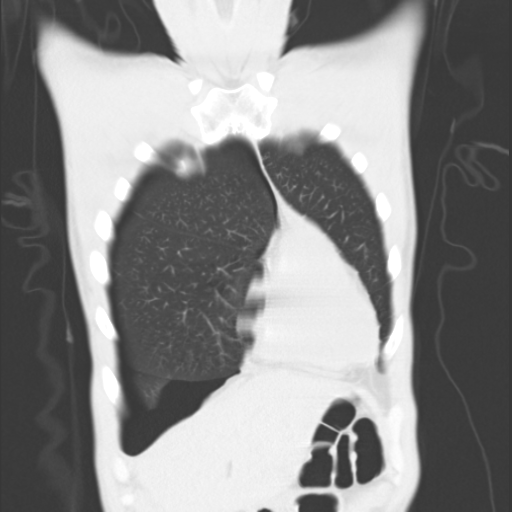
[im 26/65  lung]
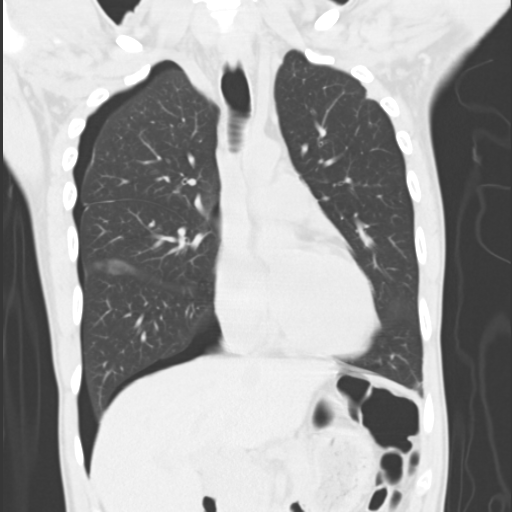
[im 39/65  lung]
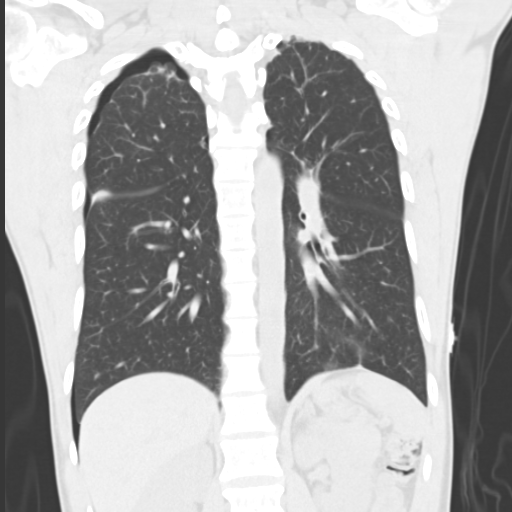

[15 of 36 positions shown; findings below may reference images not displayed]

FINDINGS: Images of the upper abdomen are unremarkable.  The
patient has a right pneumothorax.  The trachea is midline.  There
is mild mass effect along the right side of the heart. Based on the
CT topogram, there is concern for increased pleural air along the
right lung base.

Few densities along the posterior aspect of the right lower lung
may represent atelectasis.  There is a small bulla formation in the
left lower lobe on sequence 2, image 46 that measures 7 mm. There
are patchy densities in the left lower lobe with subtle
emphysematous or bulla changes. There are areas of thickening
involving the right major fissure which are nonspecific.  No
significant bulla formation in the right lung.  No acute bony
abnormality.
IMPRESSION: Right pneumothorax.  Based on the CT topogram, there is concern for
increased pleural air along the right lung base and enlargement of
the right pneumothorax.  The trachea remains midline and no
significant mediastinal shift.

Small blebs or focal emphysematous changes in the left lower lobe.
This could be related to prior inflammation.

## 2013-11-02 IMAGING — CR DG CHEST 2V
2 series · 2 of 2 positions shown · non-contrast
Comparison: 12/09/2010

CLINICAL DATA: Shortness of breath, chest pain.  Pneumothorax.

CHEST - 2 VIEW

[w chest pa]
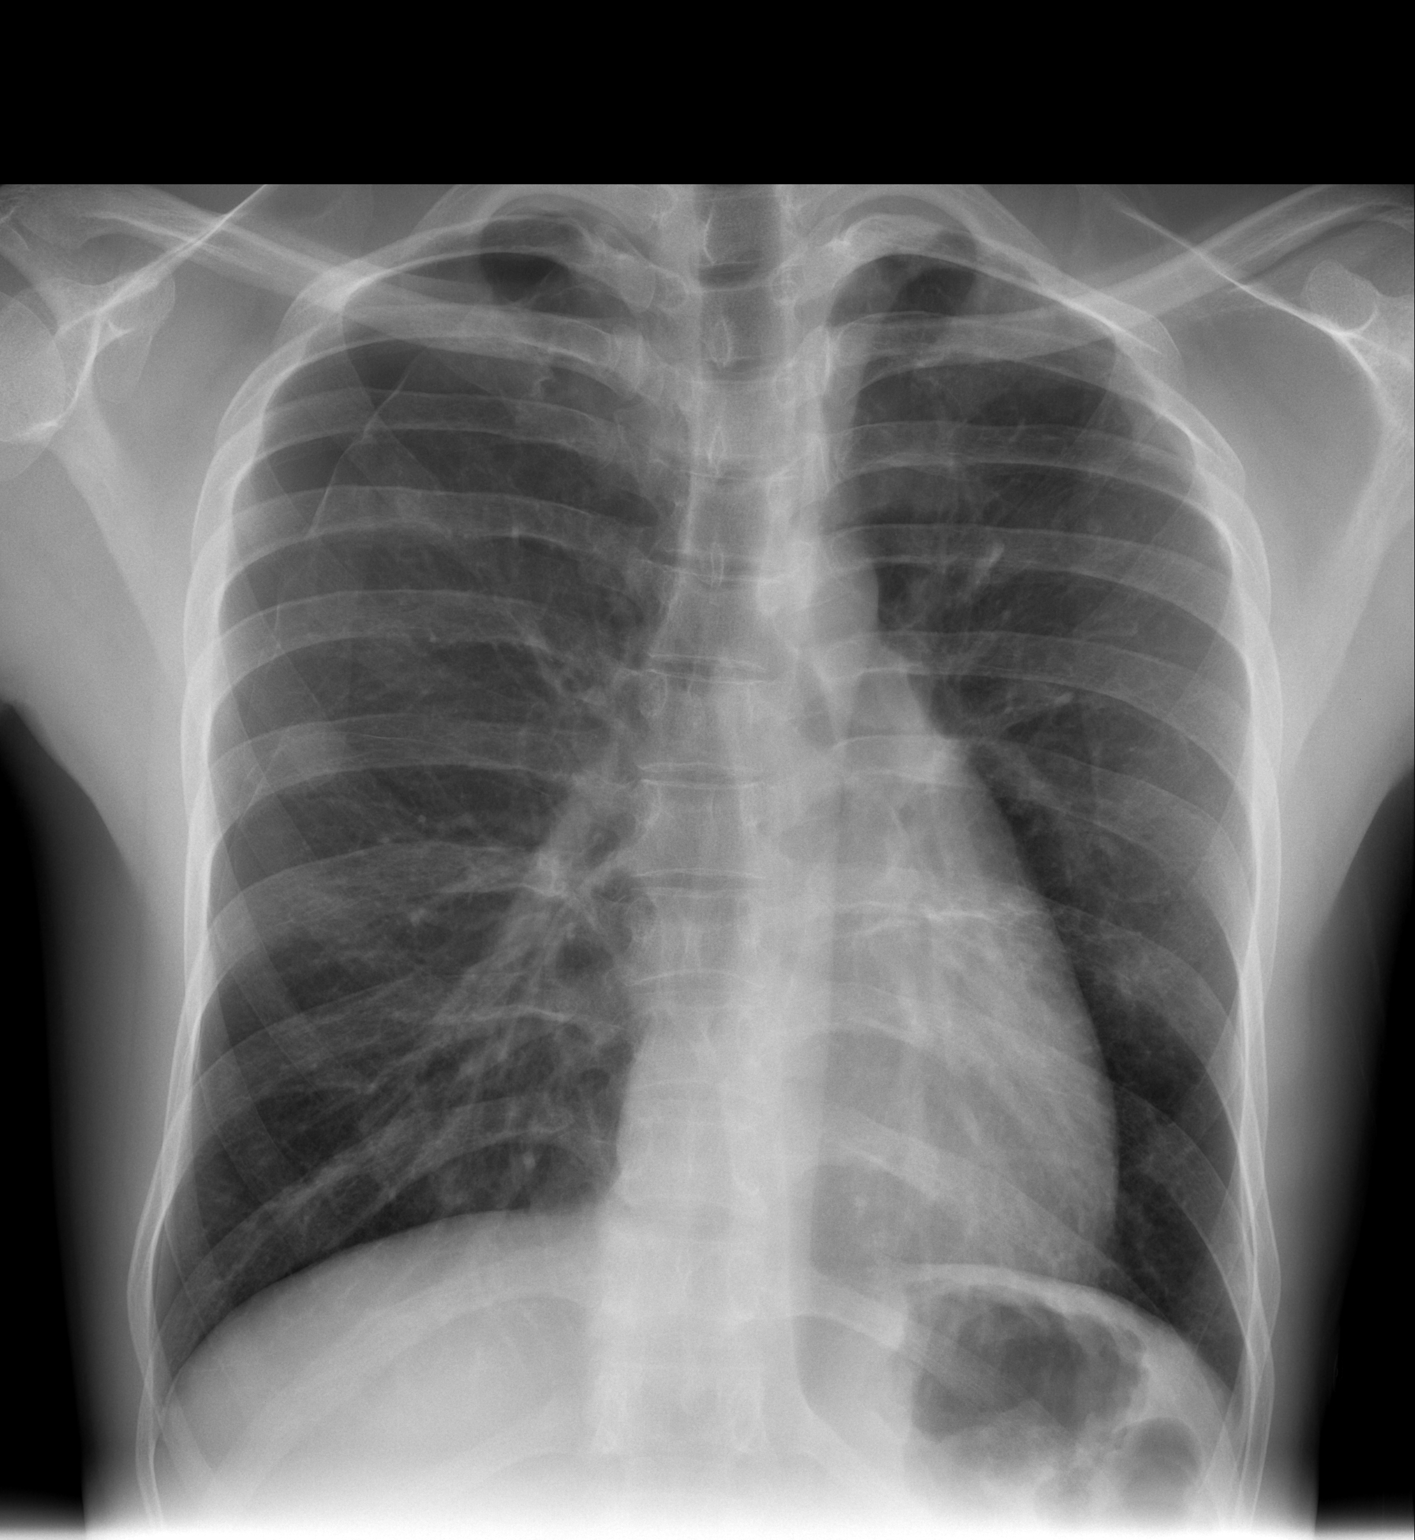

[w chest lat]
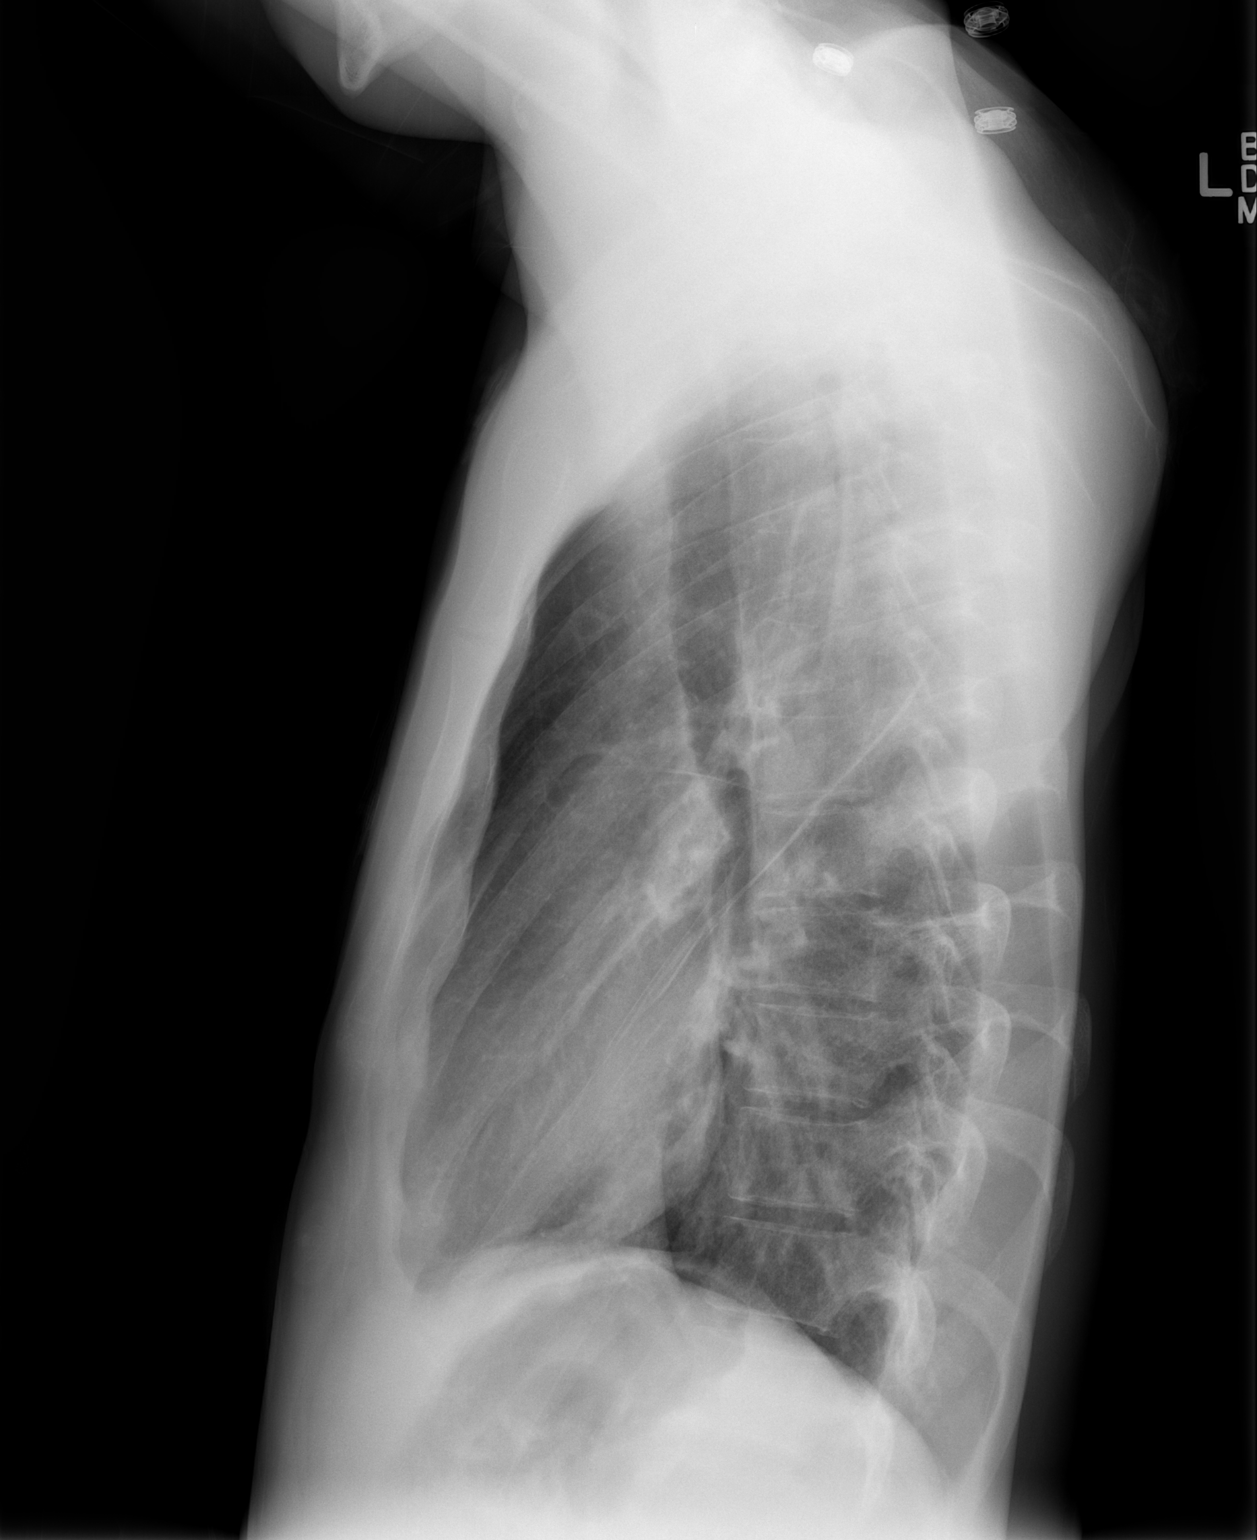

[2 of 2 positions shown; findings below may reference images not displayed]

FINDINGS: There is a right-sided pneumothorax, likely approximately
15%, similar to prior study.  Mediastinal structures are midline.
Mild hyperinflation of the lungs.  Heart is normal size.  No
effusions or acute bony abnormality.
IMPRESSION: Recurrent right pneumothorax, approximately 15%, similar to prior
study.

## 2014-06-05 ENCOUNTER — Encounter (HOSPITAL_COMMUNITY): Payer: Self-pay

## 2014-06-05 ENCOUNTER — Emergency Department (HOSPITAL_COMMUNITY)
Admission: EM | Admit: 2014-06-05 | Discharge: 2014-06-05 | Disposition: A | Discharge status: Home / Self Care | Payer: Medicare Other | Attending: Emergency Medicine | Admitting: Emergency Medicine

## 2014-06-05 DIAGNOSIS — X58XXXA Exposure to other specified factors, initial encounter: Secondary | ICD-10-CM | POA: Diagnosis not present

## 2014-06-05 DIAGNOSIS — Y9389 Activity, other specified: Secondary | ICD-10-CM | POA: Insufficient documentation

## 2014-06-05 DIAGNOSIS — Z791 Long term (current) use of non-steroidal anti-inflammatories (NSAID): Secondary | ICD-10-CM | POA: Diagnosis not present

## 2014-06-05 DIAGNOSIS — Y998 Other external cause status: Secondary | ICD-10-CM | POA: Diagnosis not present

## 2014-06-05 DIAGNOSIS — Z8709 Personal history of other diseases of the respiratory system: Secondary | ICD-10-CM | POA: Diagnosis not present

## 2014-06-05 DIAGNOSIS — M069 Rheumatoid arthritis, unspecified: Secondary | ICD-10-CM | POA: Diagnosis not present

## 2014-06-05 DIAGNOSIS — Z79899 Other long term (current) drug therapy: Secondary | ICD-10-CM | POA: Insufficient documentation

## 2014-06-05 DIAGNOSIS — Y9289 Other specified places as the place of occurrence of the external cause: Secondary | ICD-10-CM | POA: Diagnosis not present

## 2014-06-05 DIAGNOSIS — Z87891 Personal history of nicotine dependence: Secondary | ICD-10-CM | POA: Insufficient documentation

## 2014-06-05 DIAGNOSIS — R1032 Left lower quadrant pain: Secondary | ICD-10-CM | POA: Diagnosis not present

## 2014-06-05 DIAGNOSIS — Z792 Long term (current) use of antibiotics: Secondary | ICD-10-CM | POA: Insufficient documentation

## 2014-06-05 DIAGNOSIS — S3992XA Unspecified injury of lower back, initial encounter: Secondary | ICD-10-CM | POA: Diagnosis present

## 2014-06-05 DIAGNOSIS — S39012A Strain of muscle, fascia and tendon of lower back, initial encounter: Secondary | ICD-10-CM | POA: Insufficient documentation

## 2014-06-05 LAB — URINALYSIS, ROUTINE W REFLEX MICROSCOPIC
BILIRUBIN URINE: NEGATIVE
Glucose, UA: NEGATIVE mg/dL
Hgb urine dipstick: NEGATIVE
KETONES UR: NEGATIVE mg/dL
LEUKOCYTES UA: NEGATIVE
NITRITE: NEGATIVE
PROTEIN: NEGATIVE mg/dL
SPECIFIC GRAVITY, URINE: 1.021 (ref 1.005–1.030)
Urobilinogen, UA: 1 mg/dL (ref 0.0–1.0)
pH: 7 (ref 5.0–8.0)

## 2014-06-05 NOTE — ED Provider Notes (Signed)
CSN: [id number]     Arrival date & time 06/05/14  0908 History   First MD Initiated Contact with Patient 06/05/14 [phone]     Chief Complaint  Patient presents with  . Abdominal Pain  . Flank Pain     (Consider location/radiation/quality/duration/timing/severity/associated sxs/prior Treatment) HPI Comments: Patient presents to the emergency department for evaluation of back pain. Patient reports that he has a burning pain across his lower back that is constant, worsens with movement. She reports that he has been expressing intermittent similar burning pain in the left lower abdomen area down into his scrotum and perineum on the left side. Patient reports symptoms began after moving furniture. He has not identified any lumps or hernias. He has not had fever, nausea, vomiting, diarrhea or constipation. Denies urinary changes. Patient does have a history of rheumatoid arthritis, has been taking all of his pain medications as prescribed without relief.  Patient is a 34 y.o. male presenting with abdominal pain and flank pain.  Abdominal Pain Flank Pain Associated symptoms include abdominal pain.    Past Medical History  Diagnosis Date  . Rheumatoid arthritis(714.0)   . Recurrent spontaneous pneumothorax    Past Surgical History  Procedure Laterality Date  . Total hip arthroplasty  2001    right   History reviewed. No pertinent family history. History  Substance Use Topics  . Smoking status: Former Smoker -- 1.00 packs/day for 1 years    Types: Cigars    Quit date: 01/20/1999  . Smokeless tobacco: Never Used  . Alcohol Use: Yes     Comment: rare    Review of Systems  Gastrointestinal: Positive for abdominal pain.  Musculoskeletal: Positive for back pain.  All other systems reviewed and are negative.     Allergies  Sulfonamide derivatives  Home Medications   Prior to Admission medications   Medication Sig Start Date End Date Taking? Authorizing Provider  AMOXICILLIN PO  Take 1 capsule by mouth daily.   Yes Historical Provider, MD  folic acid (FOLVITE) 1 MG tablet Take 1 tablet by mouth daily. 04/19/14  Yes Historical Provider, MD  hydroxychloroquine (PLAQUENIL) 200 MG tablet Take 200 mg by mouth daily.     Yes Historical Provider, MD  meloxicam (MOBIC) 15 MG tablet Take 15 mg by mouth daily.     Yes Historical Provider, MD  methotrexate (RHEUMATREX) 2.5 MG tablet 3 tablets once a week on friday   Yes Historical Provider, MD  oxyCODONE-acetaminophen (PERCOCET) 7.5-325 MG per tablet Take 1 tablet by mouth every 8 (eight) hours as needed. pain 05/18/14  Yes Historical Provider, MD  PROAIR HFA 108 (90 BASE) MCG/ACT inhaler Take 2 sprays by mouth Every 6 hours as needed. Shortness of breath 03/20/10   Historical Provider, MD   BP 116/43 mmHg  Pulse 96  Temp(Src) 97.4 F (36.3 C) (Oral)  Resp 16  SpO2 99% Physical Exam  Constitutional: He is oriented to person, place, and time. He appears well-developed and well-nourished. No distress.  HENT:  Head: Normocephalic and atraumatic.  Right Ear: Hearing normal.  Left Ear: Hearing normal.  Nose: Nose normal.  Mouth/Throat: Oropharynx is clear and moist and mucous membranes are normal.  Eyes: Conjunctivae and EOM are normal. Pupils are equal, round, and reactive to light.  Neck: Normal range of motion. Neck supple.  Cardiovascular: Regular rhythm, S1 normal and S2 normal.  Exam reveals no gallop and no friction rub.   No murmur heard. Pulmonary/Chest: Effort normal and breath sounds normal. No  respiratory distress. He exhibits no tenderness.  Abdominal: Soft. Normal appearance and bowel sounds are normal. There is no hepatosplenomegaly. There is no tenderness. There is no rebound, no guarding, no tenderness at McBurney's point and negative Murphy's sign. No hernia. Hernia confirmed negative in the right inguinal area and confirmed negative in the left inguinal area.  Genitourinary: Testes normal and penis normal. Right  testis shows no mass, no swelling and no tenderness. Left testis shows no mass, no swelling and no tenderness.  Musculoskeletal: Normal range of motion.       Lumbar back: He exhibits tenderness.       Back:  Neurological: He is alert and oriented to person, place, and time. He has normal strength. No cranial nerve deficit or sensory deficit. Coordination normal. GCS eye subscore is 4. GCS verbal subscore is 5. GCS motor subscore is 6.  Skin: Skin is warm, dry and intact. No rash noted. No cyanosis.  Psychiatric: He has a normal mood and affect. His speech is normal and behavior is normal. Thought content normal.  Nursing note and vitals reviewed.   ED Course  Procedures (including critical care time) Labs Review Labs Reviewed  URINALYSIS, ROUTINE W REFLEX MICROSCOPIC    Imaging Review No results found.   EKG Interpretation None      MDM   Final diagnoses:  None   low back pain  Patient presents to the ER for evaluation of low back pain radiating to left groin region. Pain began after lifting heavy objects. No evidence of hernia on examination. Genital examination is normal. No testicular tenderness, swelling. Pain is in the scrotal area just posterior to the testicles. There is no overlying erythema, swelling, induration or suggestion of infection. Symptoms are consistent with musculoskeletal/mechanical back pain, possibly with some radicular component. Will treat with analgesia and rest.    Gilda Crease, MD 06/05/14 1115

## 2014-06-05 NOTE — Discharge Instructions (Signed)
Back Pain, Adult °Low back pain is very common. About 1 in 5 people have back pain. The cause of low back pain is rarely dangerous. The pain often gets better over time. About half of people with a sudden onset of back pain feel better in just 2 weeks. About 8 in 10 people feel better by 6 weeks.  °CAUSES °Some common causes of back pain include: °· Strain of the muscles or ligaments supporting the spine. °· Wear and tear (degeneration) of the spinal discs. °· Arthritis. °· Direct injury to the back. °DIAGNOSIS °Most of the time, the direct cause of low back pain is not known. However, back pain can be treated effectively even when the exact cause of the pain is unknown. Answering your caregiver's questions about your overall health and symptoms is one of the most accurate ways to make sure the cause of your pain is not dangerous. If your caregiver needs more information, he or she may order lab work or imaging tests (X-rays or MRIs). However, even if imaging tests show changes in your back, this usually does not require surgery. °HOME CARE INSTRUCTIONS °For many people, back pain returns. Since low back pain is rarely dangerous, it is often a condition that people can learn to manage on their own.  °· Remain active. It is stressful on the back to sit or stand in one place. Do not sit, drive, or stand in one place for more than 30 minutes at a time. Take short walks on level surfaces as soon as pain allows. Try to increase the length of time you walk each day. °· Do not stay in bed. Resting more than 1 or 2 days can delay your recovery. °· Do not avoid exercise or work. Your body is made to move. It is not dangerous to be active, even though your back may hurt. Your back will likely heal faster if you return to being active before your pain is gone. °· Pay attention to your body when you  bend and lift. Many people have less discomfort when lifting if they bend their knees, keep the load close to their bodies, and  avoid twisting. Often, the most comfortable positions are those that put less stress on your recovering back. °· Find a comfortable position to sleep. Use a firm mattress and lie on your side with your knees slightly bent. If you lie on your back, put a pillow under your knees. °· Only take over-the-counter or prescription medicines as directed by your caregiver. Over-the-counter medicines to reduce pain and inflammation are often the most helpful. Your caregiver may prescribe muscle relaxant drugs. These medicines help dull your pain so you can more quickly return to your normal activities and healthy exercise. °· Put ice on the injured area. °¨ Put ice in a plastic bag. °¨ Place a towel between your skin and the bag. °¨ Leave the ice on for 15-20 minutes, 03-04 times a day for the first 2 to 3 days. After that, ice and heat may be alternated to reduce pain and spasms. °· Ask your caregiver about trying back exercises and gentle massage. This may be of some benefit. °· Avoid feeling anxious or stressed. Stress increases muscle tension and can worsen back pain. It is important to recognize when you are anxious or stressed and learn ways to manage it. Exercise is a great option. °SEEK MEDICAL CARE IF: °· You have pain that is not relieved with rest or medicine. °· You have pain that does not improve in 1 week. °· You have new symptoms. °· You are generally not feeling well. °SEEK   IMMEDIATE MEDICAL CARE IF:  °· You have pain that radiates from your back into your legs. °· You develop new bowel or bladder control problems. °· You have unusual weakness or numbness in your arms or legs. °· You develop nausea or vomiting. °· You develop abdominal pain. °· You feel faint. °Document Released: 01/05/2005 Document Revised: 07/07/2011 Document Reviewed: 05/09/2013 °[employer]® Patient Information ©2015 [employer]. This information is not intended to replace advice given to you by your health care provider. Make sure you  discuss any questions you have with your health care provider. ° °Lumbosacral Strain °Lumbosacral strain is a strain of any of the parts that make up your lumbosacral vertebrae. Your lumbosacral vertebrae are the bones that make up the lower third of your backbone. Your lumbosacral vertebrae are held together by muscles and tough, fibrous tissue (ligaments).  °CAUSES  °A sudden blow to your back can cause lumbosacral strain. Also, anything that causes an excessive stretch of the muscles in the low back can cause this strain. This is typically seen when people exert themselves strenuously, fall, lift heavy objects, bend, or crouch repeatedly. °RISK FACTORS °· Physically demanding work. °· Participation in pushing or pulling sports or sports that require a sudden twist of the back (tennis, golf, baseball). °· Weight lifting. °· Excessive lower back curvature. °· Forward-tilted pelvis. °· Weak back or abdominal muscles or both. °· Tight hamstrings. °SIGNS AND SYMPTOMS  °Lumbosacral strain may cause pain in the area of your injury or pain that moves (radiates) down your leg.  °DIAGNOSIS °Your health care provider can often diagnose lumbosacral strain through a physical exam. In some cases, you may need tests such as X-ray exams.  °TREATMENT  °Treatment for your lower back injury depends on many factors that your clinician will have to evaluate. However, most treatment will include the use of anti-inflammatory medicines. °HOME CARE INSTRUCTIONS  °· Avoid hard physical activities (tennis, racquetball, waterskiing) if you are not in proper physical condition for it. This may aggravate or create problems. °· If you have a back problem, avoid sports requiring sudden body movements. Swimming and walking are generally safer activities. °· Maintain good posture. °· Maintain a healthy weight. °· For acute conditions, you may put ice on the injured area. °¨ Put ice in a plastic bag. °¨ Place a towel between your skin and the  bag. °¨ Leave the ice on for 20 minutes, 2-3 times a day. °· When the low back starts healing, stretching and strengthening exercises may be recommended. °SEEK MEDICAL CARE IF: °· Your back pain is getting worse. °· You experience severe back pain not relieved with medicines. °SEEK IMMEDIATE MEDICAL CARE IF:  °· You have numbness, tingling, weakness, or problems with the use of your arms or legs. °· There is a change in bowel or bladder control. °· You have increasing pain in any area of the body, including your belly (abdomen). °· You notice shortness of breath, dizziness, or feel faint. °· You feel sick to your stomach (nauseous), are throwing up (vomiting), or become sweaty. °· You notice discoloration of your toes or legs, or your feet get very cold. °MAKE SURE YOU:  °· Understand these instructions. °· Will watch your condition. °· Will get help right away if you are not doing well or get worse. °Document Released: 10/15/2004 Document Revised: 01/10/2013 Document Reviewed: 08/24/2012 °ExitCare® Patient Information ©2015 [employer]. This information is not intended to replace advice given to you by your health care provider.   Make sure you discuss any questions you have with your health care provider. ° °

## 2014-06-05 NOTE — ED Notes (Signed)
Pt with left lower abdominal pain radiating to left back. Pt states he was moving furniture and pain occurred after that.  Pt with no n/v.  No fever.  No diarrhea.  No change in urination.  No hx of kidney stones.  Pain is worse with movement.  Bending and sitting.

## 2014-09-16 ENCOUNTER — Emergency Department (HOSPITAL_COMMUNITY)
Admission: EM | Admit: 2014-09-16 | Discharge: 2014-09-16 | Disposition: A | Discharge status: Home / Self Care | Payer: Medicare Other | Attending: Emergency Medicine | Admitting: Emergency Medicine

## 2014-09-16 ENCOUNTER — Encounter (HOSPITAL_COMMUNITY): Payer: Self-pay | Admitting: Emergency Medicine

## 2014-09-16 DIAGNOSIS — M069 Rheumatoid arthritis, unspecified: Secondary | ICD-10-CM | POA: Insufficient documentation

## 2014-09-16 DIAGNOSIS — M5432 Sciatica, left side: Secondary | ICD-10-CM | POA: Diagnosis not present

## 2014-09-16 DIAGNOSIS — Z8709 Personal history of other diseases of the respiratory system: Secondary | ICD-10-CM | POA: Diagnosis not present

## 2014-09-16 DIAGNOSIS — Z79899 Other long term (current) drug therapy: Secondary | ICD-10-CM | POA: Diagnosis not present

## 2014-09-16 DIAGNOSIS — Z87891 Personal history of nicotine dependence: Secondary | ICD-10-CM | POA: Insufficient documentation

## 2014-09-16 DIAGNOSIS — M545 Low back pain: Secondary | ICD-10-CM | POA: Diagnosis present

## 2014-09-16 MED ORDER — MELOXICAM 15 MG PO TABS: 15.0000 mg | ORAL_TABLET | Freq: Every day | ORAL | Status: AC

## 2014-09-16 NOTE — ED Notes (Addendum)
Pt from home c/o low back pain x 1 week. He reports taking oxycodone at home without relief and using heat. Pain worse with movement.

## 2014-09-16 NOTE — ED Provider Notes (Signed)
CSN: [id number]     Arrival date & time 09/16/14  2112 History  This chart was scribed for non-physician practitioner, Earley Favor, NP working with Cathren Laine, MD by Doreatha Martin, ED scribe. This patient was seen in room WTR6/WTR6 and the patient's care was started at 9:22 PM    Chief Complaint  Patient presents with  . Back Pain   The history is provided by the patient. No language interpreter was used.    HPI Comments: Malik Barker is a 34 y.o. male who presents to the Emergency Department complaining of moderate lower back pain that shoots down the left leg onset one week ago and worsened tonight. He reports that he has been picking up his baby and moving TVs for the past week. Pt reports that his 34 month old is very mobile and he is frequently bending over. He states mild relief with Mobic,when taken  heat compress with mild relief. Pt states pain is worsened in semi-fowlers position and relieved when standing up. Pt is in-between Rheumatologists due to a recent retirement. He denies Ibuprofen use. He also denies numbness, focal weakness.   Past Medical History  Diagnosis Date  . Rheumatoid arthritis(714.0)   . Recurrent spontaneous pneumothorax    Past Surgical History  Procedure Laterality Date  . Total hip arthroplasty  2001    right   No family history on file. Social History  Substance Use Topics  . Smoking status: Former Smoker -- 1.00 packs/day for 1 years    Types: Cigars    Quit date: 01/20/1999  . Smokeless tobacco: Never Used  . Alcohol Use: Yes     Comment: rare    Review of Systems  Musculoskeletal: Positive for back pain.  Skin: Negative for rash.  Neurological: Negative for weakness and numbness.  All other systems reviewed and are negative.  Allergies  Sulfonamide derivatives  Home Medications   Prior to Admission medications   Medication Sig Start Date End Date Taking? Authorizing Provider  folic acid (FOLVITE) 1 MG tablet Take 1 tablet by mouth  daily. 04/19/14  Yes Historical Provider, MD  hydroxychloroquine (PLAQUENIL) 200 MG tablet Take 200 mg by mouth daily.     Yes Historical Provider, MD  methotrexate (RHEUMATREX) 2.5 MG tablet 3 tablets once a week on friday   Yes Historical Provider, MD  AMOXICILLIN PO Take 1 capsule by mouth daily.    Historical Provider, MD  meloxicam (MOBIC) 15 MG tablet Take 1 tablet (15 mg total) by mouth daily. 09/16/14   Earley Favor, NP  oxyCODONE-acetaminophen (PERCOCET) 7.5-325 MG per tablet Take 1 tablet by mouth every 8 (eight) hours as needed. pain 05/18/14   Historical Provider, MD  PROAIR HFA 108 (90 BASE) MCG/ACT inhaler Take 2 sprays by mouth Every 6 hours as needed. Shortness of breath 03/20/10   Historical Provider, MD   BP 122/75 mmHg  Pulse 76  Temp(Src) 98.6 F (37 C) (Oral)  Resp 18  Wt 138 lb (62.596 kg)  SpO2 98% Physical Exam  Constitutional: He is oriented to person, place, and time. He appears well-developed and well-nourished.  HENT:  Head: Normocephalic and atraumatic.  Eyes: Conjunctivae and EOM are normal. Pupils are equal, round, and reactive to light.  Neck: Normal range of motion. Neck supple.  Cardiovascular: Normal rate.   Pulmonary/Chest: Effort normal. No respiratory distress.  Abdominal: He exhibits no distension.  Musculoskeletal: Normal range of motion. He exhibits tenderness.       Back:  Neurological: He  is alert and oriented to person, place, and time.  Skin: Skin is warm and dry.  Psychiatric: He has a normal mood and affect. His behavior is normal.  Nursing note and vitals reviewed.   ED Course  Procedures (including critical care time) DIAGNOSTIC STUDIES: Oxygen Saturation is 98% on RA, normal by my interpretation.    COORDINATION OF CARE: 9:28 PM Discussed treatment plan with pt at bedside and pt agreed to plan.   Labs Review Labs Reviewed - No data to display  Imaging Review No results found. I have personally reviewed and evaluated these  images and lab results as part of my medical decision-making.   EKG Interpretation None      MDM   Final diagnoses:  Sciatica, left    I personally performed the services described in this documentation, which was scribed in my presence. The recorded information has been reviewed and is accurate.  Earley Favor, NP 09/16/14 2458  Cathren Laine, MD 09/16/14 [phone]

## 2015-12-21 ENCOUNTER — Emergency Department (HOSPITAL_COMMUNITY)
Admission: EM | Admit: 2015-12-21 | Discharge: 2015-12-21 | Disposition: A | Discharge status: Home / Self Care | Payer: Medicare Other | Attending: Emergency Medicine | Admitting: Emergency Medicine

## 2015-12-21 ENCOUNTER — Encounter (HOSPITAL_COMMUNITY): Payer: Self-pay | Admitting: Oncology

## 2015-12-21 DIAGNOSIS — Z96641 Presence of right artificial hip joint: Secondary | ICD-10-CM | POA: Insufficient documentation

## 2015-12-21 DIAGNOSIS — Z79899 Other long term (current) drug therapy: Secondary | ICD-10-CM | POA: Diagnosis not present

## 2015-12-21 DIAGNOSIS — M62838 Other muscle spasm: Secondary | ICD-10-CM

## 2015-12-21 DIAGNOSIS — Z87891 Personal history of nicotine dependence: Secondary | ICD-10-CM | POA: Diagnosis not present

## 2015-12-21 DIAGNOSIS — M542 Cervicalgia: Secondary | ICD-10-CM | POA: Diagnosis present

## 2015-12-21 DIAGNOSIS — J45909 Unspecified asthma, uncomplicated: Secondary | ICD-10-CM | POA: Insufficient documentation

## 2015-12-21 MED ORDER — KETOROLAC TROMETHAMINE 60 MG/2ML IM SOLN
30.0000 mg | Freq: Once | INTRAMUSCULAR | Status: DC
  Filled 2015-12-21: qty 2

## 2015-12-21 NOTE — ED Triage Notes (Signed)
Pt states approximately 3 hours of right neck pain that radiates to his shoulder.  Denies injury.  Pt states the pain feels like, "When you swallow a pill and it gets stuck."

## 2015-12-21 NOTE — ED Notes (Signed)
PT DISCHARGED. INSTRUCTIONS GIVEN. AAOX4. PT IN NO APPARENT DISTRESS. THE OPPORTUNITY TO ASK QUESTIONS WAS PROVIDED. 

## 2015-12-21 NOTE — ED Provider Notes (Signed)
[facility]-EMERGENCY DEPT Provider Note   CSN: [id number] Arrival date & time: 12/21/15  2008     History   Chief Complaint Chief Complaint  Patient presents with  . Neck Pain     HPI   Blood pressure 141/80, pulse 79, temperature 98.2 F (36.8 C), temperature source Oral, resp. rate 16, height 5\' 10"  (1.778 m), weight 59 kg, SpO2 100 %.  Malik Barker is a 35 y.o. male with past medical history significant for RA and recurrent pneumothorax complaining of acute onset of left medial neck pain radiating down to the clavicle feels pain inside his neck States he feels like something may be stuck in his throat or having some difficulty when swallowing. Onset 3 hours ago. He denies shortness of breath or similar sensation to prior pneumothorax. States pain is exacerbated with movement.  Past Medical History:  Diagnosis Date  . Recurrent spontaneous pneumothorax   . Rheumatoid arthritis(714.0)     Patient Active Problem List   Diagnosis Date Noted  . Spontaneous pneumothorax 10/12/2011  . 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 Other pneumothorax 04/07/2010  . Rheumatoid arthritis(714.0) 04/04/2010    Past Surgical History:  Procedure Laterality Date  . TOTAL HIP ARTHROPLASTY  2001   right       Home Medications    Prior to Admission medications   Medication Sig Start Date End Date Taking? Authorizing Provider  AMOXICILLIN PO Take 1 capsule by mouth daily.    Historical Provider, MD  folic acid (FOLVITE) 1 MG tablet Take 1 tablet by mouth daily. 04/19/14   Historical Provider, MD  hydroxychloroquine (PLAQUENIL) 200 MG tablet Take 200 mg by mouth daily.      Historical Provider, MD  meloxicam (MOBIC) 15 MG tablet Take 1 tablet (15 mg total) by mouth daily. 09/16/14   09/18/14, NP  methotrexate (RHEUMATREX) 2.5 MG tablet 3 tablets once a week on friday    Historical Provider, MD  oxyCODONE-acetaminophen (PERCOCET) 7.5-325 MG per tablet Take 1 tablet by  mouth every 8 (eight) hours as needed. pain 05/18/14   Historical Provider, MD  PROAIR HFA 108 (90 BASE) MCG/ACT inhaler Take 2 sprays by mouth Every 6 hours as needed. Shortness of breath 03/20/10   Historical Provider, MD    Family History No family history on file.  Social History Social History  Substance Use Topics  . Smoking status: Former Smoker    Packs/day: 1.00    Years: 1.00    Types: Cigars    Quit date: 01/20/1999  . Smokeless tobacco: Never Used  . Alcohol use Yes     Comment: rare     Allergies   Sulfonamide derivatives   Review of Systems Review of Systems  10 systems reviewed and found to be negative, except as noted in the HPI.   Physical Exam Updated Vital Signs BP 141/80 (BP Location: Left Arm)   Pulse 79   Temp 98.2 F (36.8 C) (Oral)   Resp 16   Ht 5\' 10"  (1.778 m)   Wt 59 kg   SpO2 100%   BMI 18.65 kg/m   Physical Exam  Constitutional: He is oriented to person, place, and time. He appears well-developed and well-nourished. No distress.  HENT:  Head: Normocephalic and atraumatic.  Mouth/Throat: Oropharynx is clear and moist.  Swallowing his secretions without issue, posterior pharynx with no erythema, no tonsillar hypertrophy, uvula is midline, swallowing his secretions without issue, no tenderness palpation or elevation of  tong  Eyes: Conjunctivae and EOM are normal. Pupils are equal, round, and reactive to light.  Neck: Normal range of motion.    Full range of motion, movement exacerbates pain, no bruits. Tenderness palpation as diagrammed  Cardiovascular: Normal rate, regular rhythm and intact distal pulses.   Pulmonary/Chest: Effort normal and breath sounds normal.  Abdominal: Soft. There is no tenderness.  Musculoskeletal: Normal range of motion.  Neurological: He is alert and oriented to person, place, and time.  Skin: He is not diaphoretic.  Psychiatric: He has a normal mood and affect.  Nursing note and vitals reviewed.    ED  Treatments / Results  Labs (all labs ordered are listed, but only abnormal results are displayed) Labs Reviewed - No data to display  EKG  EKG Interpretation None       Radiology No results found.  Procedures Procedures (including critical care time)  Medications Ordered in ED Medications  ketorolac (TORADOL) injection 30 mg (30 mg Intramuscular Not Given 12/21/15 2141)     Initial Impression / Assessment and Plan / ED Course  I have reviewed the triage vital signs and the nursing notes.  Pertinent labs & imaging results that were available during my care of the patient were reviewed by me and considered in my medical decision making (see chart for details).  Clinical Course     Vitals:   12/21/15 2017  BP: 141/80  Pulse: 79  Resp: 16  Temp: 98.2 F (36.8 C)  TempSrc: Oral  SpO2: 100%  Weight: 59 kg  Height: 5\' 10"  (1.778 m)    Medications  ketorolac (TORADOL) injection 30 mg (30 mg Intramuscular Not Given 12/21/15 2141)    Malik Barker is 36 y.o. male presenting with Left neck pain onset 3 hours ago, mild and exacerbated by movement and palpation. Lung sounds clear, he has a history of recurrent pneumothorax however he states this feels dissimilar. Swallowing secretions without issue, likely muscle strain.  Evaluation does not show pathology that would require ongoing emergent intervention or inpatient treatment. Pt is hemodynamically stable and mentating appropriately. Discussed findings and plan with patient/guardian, who agrees with care plan. All questions answered. Return precautions discussed and outpatient follow up given.      Final Clinical Impressions(s) / ED Diagnoses   Final diagnoses:  Muscle spasms of neck    New Prescriptions Discharge Medication List as of 12/21/2015  9:22 PM       14/02/2015, PA-C 12/21/15 2225    14/02/17, MD 12/23/15 [id number]

## 2015-12-21 NOTE — Discharge Instructions (Signed)
For pain control please take ibuprofen (also known as Motrin or Advil) 800mg (this is normally 4 over the counter pills) 3 times a day  for 5 days. Take with food to minimize stomach irritation. ° ° °Please follow with your primary care doctor in the next 2 days for a check-up. They must obtain records for further management.  ° °Do not hesitate to return to the Emergency Department for any new, worsening or concerning symptoms.  ° °

## 2016-03-30 ENCOUNTER — Emergency Department (HOSPITAL_COMMUNITY)
Admission: EM | Admit: 2016-03-30 | Discharge: 2016-03-30 | Disposition: A | Discharge status: Home / Self Care | Payer: Medicare Other | Attending: Emergency Medicine | Admitting: Emergency Medicine

## 2016-03-30 ENCOUNTER — Encounter (HOSPITAL_COMMUNITY): Payer: Self-pay | Admitting: *Deleted

## 2016-03-30 DIAGNOSIS — Z87891 Personal history of nicotine dependence: Secondary | ICD-10-CM | POA: Insufficient documentation

## 2016-03-30 DIAGNOSIS — Y999 Unspecified external cause status: Secondary | ICD-10-CM | POA: Insufficient documentation

## 2016-03-30 DIAGNOSIS — X58XXXA Exposure to other specified factors, initial encounter: Secondary | ICD-10-CM | POA: Insufficient documentation

## 2016-03-30 DIAGNOSIS — Z79899 Other long term (current) drug therapy: Secondary | ICD-10-CM | POA: Insufficient documentation

## 2016-03-30 DIAGNOSIS — S79921A Unspecified injury of right thigh, initial encounter: Secondary | ICD-10-CM | POA: Diagnosis present

## 2016-03-30 DIAGNOSIS — Y929 Unspecified place or not applicable: Secondary | ICD-10-CM | POA: Diagnosis not present

## 2016-03-30 DIAGNOSIS — Z96641 Presence of right artificial hip joint: Secondary | ICD-10-CM | POA: Insufficient documentation

## 2016-03-30 DIAGNOSIS — S76311A Strain of muscle, fascia and tendon of the posterior muscle group at thigh level, right thigh, initial encounter: Secondary | ICD-10-CM | POA: Diagnosis not present

## 2016-03-30 DIAGNOSIS — J45909 Unspecified asthma, uncomplicated: Secondary | ICD-10-CM | POA: Diagnosis not present

## 2016-03-30 DIAGNOSIS — Y939 Activity, unspecified: Secondary | ICD-10-CM | POA: Insufficient documentation

## 2016-03-30 MED ORDER — NAPROXEN 500 MG PO TABS: 500.0000 mg | ORAL_TABLET | Freq: Two times a day (BID) | ORAL | 0 refills | Status: AC

## 2016-03-30 NOTE — ED Notes (Signed)
Pt reports left leg pain, that started on Saturday, at first it was a groin pain which moved to the back of his leg and is now c/o numbness to his left leg.

## 2016-03-30 NOTE — ED Provider Notes (Signed)
[facility]-EMERGENC[facility] DEPT Provider Note   CSN: [id number] Arrival date & time: 03/30/16  1315     History   Chief Complaint Chief Complaint  Patient presents with  . leg numbness    HPI Neshawn Aird is a 36 y.o. male.  The history is provided by the patient.  Leg Pain   This is a new problem. The current episode started more than 1 week ago. The problem occurs constantly. The problem has not changed since onset.The pain is present in the right hip and right upper leg. The quality of the pain is described as sharp and intermittent. The pain is moderate. Associated symptoms include numbness (described as a sharp burning, no loss of sensation). Pertinent negatives include full range of motion. He has tried nothing for the symptoms. The treatment provided no relief. There has been no history of extremity trauma.    Past Medical History:  Diagnosis Date  . Recurrent spontaneous pneumothorax   . Rheumatoid arthritis(714.0)     Patient Active Problem List   Diagnosis Date Noted  . Spontaneous pneumothorax 10/12/2011  . 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 Other pneumothorax 04/07/2010  . Rheumatoid arthritis(714.0) 04/04/2010    Past Surgical History:  Procedure Laterality Date  . TOTAL HIP ARTHROPLASTY  2001   right       Home Medications    Prior to Admission medications   Medication Sig Start Date End Date Taking? Authorizing Provider  AMOXICILLIN PO Take 1 capsule by mouth daily.    Historical Provider, MD  folic acid (FOLVITE) 1 MG tablet Take 1 tablet by mouth daily. 04/19/14   Historical Provider, MD  hydroxychloroquine (PLAQUENIL) 200 MG tablet Take 200 mg by mouth daily.      Historical Provider, MD  meloxicam (MOBIC) 15 MG tablet Take 1 tablet (15 mg total) by mouth daily. 09/16/14   Earley Favor, NP  methotrexate (RHEUMATREX) 2.5 MG tablet 3 tablets once a week on friday    Historical Provider, MD  oxyCODONE-acetaminophen (PERCOCET) 7.5-325  MG per tablet Take 1 tablet by mouth every 8 (eight) hours as needed. pain 05/18/14   Historical Provider, MD  PROAIR HFA 108 (90 BASE) MCG/ACT inhaler Take 2 sprays by mouth Every 6 hours as needed. Shortness of breath 03/20/10   Historical Provider, MD    Family History No family history on file.  Social History Social History  Substance Use Topics  . Smoking status: Former Smoker    Packs/day: 1.00    Years: 1.00    Types: Cigars    Quit date: 01/20/1999  . Smokeless tobacco: Never Used  . Alcohol use Yes     Comment: rare     Allergies   Sulfonamide derivatives   Review of Systems Review of Systems  Neurological: Positive for numbness (described as a sharp burning, no loss of sensation).  All other systems reviewed and are negative.    Physical Exam Updated Vital Signs BP 111/73   Pulse 89   Temp 98.4 F (36.9 C)   Resp 16   Ht 5\' 10"  (1.778 m)   Wt 135 lb (61.2 kg)   SpO2 97%   BMI 19.37 kg/m   Physical Exam  Constitutional: He is oriented to person, place, and time. He appears well-developed and well-nourished. No distress.  HENT:  Head: Normocephalic and atraumatic.  Nose: Nose normal.  Eyes: Conjunctivae are normal.  Neck: Neck supple. No tracheal deviation present.  Cardiovascular: Normal rate and  regular rhythm.   Pulmonary/Chest: Effort normal. No respiratory distress.  Abdominal: Soft. He exhibits no distension. Hernia confirmed negative in the right inguinal area.  Musculoskeletal:       Right upper leg: He exhibits tenderness (and tightness with hip flexion over hamstring muscles). He exhibits no deformity.  Neurological: He is alert and oriented to person, place, and time.  Skin: Skin is warm and dry.  Psychiatric: He has a normal mood and affect.     ED Treatments / Results  Labs (all labs ordered are listed, but only abnormal results are displayed) Labs Reviewed - No data to display  EKG  EKG Interpretation None        Radiology No results found.  Procedures Procedures (including critical care time)  Medications Ordered in ED Medications - No data to display   Initial Impression / Assessment and Plan / ED Course  I have reviewed the triage vital signs and the nursing notes.  Pertinent labs & imaging results that were available during my care of the patient were reviewed by me and considered in my medical decision making (see chart for details).     36 y.o. male presents with Pain in his right posterior leg. He first noticed that it was in his groin but then spread down. He describes a neuropathic type pain that is reproducible with right hip flexion and hamstring stretching. He is cutting hair 6 days a week and has a tendency to un equally balance his weight on his right leg which appears to have exacerbated a muscle strain. I recommended stretching and a short course of NSAIDs as definitive treatment. Follow-up recommended with primary care physician or return precautions discussed for worsening or new concerning symptoms. Patient currently denies any true numbness, bowel or bladder symptoms, loss of range of motion and he is ambulatory without difficulty.   Final Clinical Impressions(s) / ED Diagnoses   Final diagnoses:  Right hamstring muscle strain, initial encounter    New Prescriptions New Prescriptions   NAPROXEN (NAPROSYN) 500 MG TABLET    Take 1 tablet (500 mg total) by mouth 2 (two) times daily with a meal. Please do not take meloxicam while taking this     Lyndal Pulley, MD 03/30/16 1501

## 2019-08-04 ENCOUNTER — Ambulatory Visit: Payer: Medicare Other | Admitting: Podiatry

## 2019-08-09 ENCOUNTER — Ambulatory Visit (INDEPENDENT_AMBULATORY_CARE_PROVIDER_SITE_OTHER): Payer: Medicare Other | Admitting: Podiatry

## 2019-08-09 ENCOUNTER — Other Ambulatory Visit: Payer: Self-pay

## 2019-08-09 DIAGNOSIS — Q828 Other specified congenital malformations of skin: Secondary | ICD-10-CM | POA: Diagnosis not present

## 2019-08-09 DIAGNOSIS — L989 Disorder of the skin and subcutaneous tissue, unspecified: Secondary | ICD-10-CM

## 2019-08-10 ENCOUNTER — Encounter: Payer: Self-pay | Admitting: Podiatry

## 2019-08-10 NOTE — Progress Notes (Signed)
Subjective:  Patient ID: Malik Barker, male    DOB: 1980/12/09,  MRN: [id number]  Chief Complaint  Patient presents with  . Callouses    pt is here for a corn/callus trim    39 y.o. male presents with the above complaint.  Patient presents with complaint of right hallux plantar IPJ porokeratosis/benign skin lesion.  Patient states is very painful to walk on.  It started off with small white lesion and then it got progressively worse.  He states that he had it removed by Dr. Grandville Silos.  He denies any other acute complaints.  He has not seen anyone else prior to seeing me.  He states is continuously painful his pain scale 7 out of 10.  He has hard time walking on flat surfaces or hard surfaces.  He denies any other acute complaints   Review of Systems: Negative except as noted in the HPI. Denies N/V/F/Ch.  Past Medical History:  Diagnosis Date  . Recurrent spontaneous pneumothorax   . Rheumatoid arthritis(714.0)     Current Outpatient Medications:  .  AMOXICILLIN PO, Take 1 capsule by mouth daily., Disp: , Rfl:  .  folic acid (FOLVITE) 1 MG tablet, Take 1 tablet by mouth daily., Disp: , Rfl: 5 .  hydroxychloroquine (PLAQUENIL) 200 MG tablet, Take 200 mg by mouth daily.  , Disp: , Rfl:  .  meloxicam (MOBIC) 15 MG tablet, Take 1 tablet (15 mg total) by mouth daily., Disp: 30 tablet, Rfl: 0 .  methotrexate (RHEUMATREX) 2.5 MG tablet, 3 tablets once a week on friday, Disp: , Rfl:  .  oxyCODONE-acetaminophen (PERCOCET) 7.5-325 MG per tablet, Take 1 tablet by mouth every 8 (eight) hours as needed. pain, Disp: , Rfl: 0 .  PROAIR HFA 108 (90 BASE) MCG/ACT inhaler, Take 2 sprays by mouth Every 6 hours as needed. Shortness of breath, Disp: , Rfl:   Social History   Tobacco Use  Smoking Status Former Smoker  . Packs/day: 1.00  . Years: 1.00  . Pack years: 1.00  . Types: Cigars  . Quit date: 01/20/1999  . Years since quitting: 20.5  Smokeless Tobacco Never Used    Allergies  Allergen  Reactions  . Sulfonamide Derivatives     Childhood allergy  . Sulfur Other (See Comments)    Unsure when pt has surgery they have that listed  . Sulfamethoxazole Rash   Objective:  There were no vitals filed for this visit. There is no height or weight on file to calculate BMI. Constitutional Well developed. Well nourished.  Vascular Dorsalis pedis pulses palpable bilaterally. Posterior tibial pulses palpable bilaterally. Capillary refill normal to all digits.  No cyanosis or clubbing noted. Pedal hair growth normal.  Neurologic Normal speech. Oriented to person, place, and time. Epicritic sensation to light touch grossly present bilaterally.  Dermatologic  hyperkeratotic skin lesion noted to the right plantar IPJ of the hallux with central white nucleated core.  No pinpoint bleeding noted.  No ulceration noted.  Orthopedic: Normal joint ROM without pain or crepitus bilaterally. No visible deformities. No bony tenderness.   Radiographs: None Assessment:   1. Benign skin lesion   2. Porokeratosis    Plan:  Patient was evaluated and treated and all questions answered.  Right plantar hallux IPJ porokeratosis/benign skin lesion -I explained to the patient the etiology of porokeratosis and various treatment options were extensively discussed with the patient.  Given that he is having excruciating pain I believe patient will benefit from aggressive debridement of the  lesion with application of Cantharone therapy to help destroy the lesion.  Patient agrees with the plan would like to proceed with Cantharone application. --Lesion was debrided today without complications. Hemostasis was achieved and the area was cleaned. Cantharone was applied followed by an occlusive bandage. Post procedure complications were discussed. Monitor for signs or symptoms of infection and directed to call the office mainly should any occur.   No follow-ups on file.

## 2019-08-30 ENCOUNTER — Ambulatory Visit: Payer: Medicare Other | Admitting: Podiatry

## 2019-11-22 ENCOUNTER — Encounter: Payer: Self-pay | Admitting: Podiatry

## 2019-11-22 ENCOUNTER — Other Ambulatory Visit: Payer: Self-pay

## 2019-11-22 ENCOUNTER — Ambulatory Visit (INDEPENDENT_AMBULATORY_CARE_PROVIDER_SITE_OTHER): Payer: Medicare Other | Admitting: Podiatry

## 2019-11-22 DIAGNOSIS — L84 Corns and callosities: Secondary | ICD-10-CM | POA: Diagnosis not present

## 2019-11-22 DIAGNOSIS — Q828 Other specified congenital malformations of skin: Secondary | ICD-10-CM

## 2019-11-22 DIAGNOSIS — L989 Disorder of the skin and subcutaneous tissue, unspecified: Secondary | ICD-10-CM

## 2019-11-22 NOTE — Progress Notes (Signed)
Subjective:  Patient ID: Malik Barker, male    DOB: 01/03/1981,  MRN: [id number]  Chief Complaint  Patient presents with  . Callouses    3 week follow up benign skin lesion right foot    39 y.o. male presents with the above complaint.  Patient presents with follow-up of right hallux plantar IPJ porokeratosis/benign skin lesion.  Patient states is still painful.  He had it removed in the past but is still continues to hurt him.  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  He states is also painful to touch.  I discussed with him shoe gear modification.  He denies any other acute complaints.   Review of Systems: Negative except as noted in the HPI. Denies N/V/F/Ch.  Past Medical History:  Diagnosis Date  . Recurrent spontaneous pneumothorax   . Rheumatoid arthritis(714.0)     Current Outpatient Medications:  .  acetaminophen-codeine (TYLENOL #3) 300-30 MG tablet, , Disp: , Rfl:  .  AMOXICILLIN PO, Take 1 capsule by mouth daily., Disp: , Rfl:  .  folic acid (FOLVITE) 1 MG tablet, Take 1 tablet by mouth daily., Disp: , Rfl: 5 .  HYDROcodone-acetaminophen (NORCO) 10-325 MG tablet, , Disp: , Rfl:  .  hydroxychloroquine (PLAQUENIL) 200 MG tablet, Take 200 mg by mouth daily.  , Disp: , Rfl:  .  meloxicam (MOBIC) 15 MG tablet, Take 1 tablet (15 mg total) by mouth daily., Disp: 30 tablet, Rfl: 0 .  methotrexate (RHEUMATREX) 2.5 MG tablet, 3 tablets once a week on friday, Disp: , Rfl:  .  oxyCODONE-acetaminophen (PERCOCET) 7.5-325 MG per tablet, Take 1 tablet by mouth every 8 (eight) hours as needed. pain, Disp: , Rfl: 0 .  PROAIR HFA 108 (90 BASE) MCG/ACT inhaler, Take 2 sprays by mouth Every 6 hours as needed. Shortness of breath, Disp: , Rfl:   Social History   Tobacco Use  Smoking Status Former Smoker  . Packs/day: 1.00  . Years: 1.00  . Pack years: 1.00  . Types: Cigars  . Quit date: 01/20/1999  . Years since quitting: 20.8  Smokeless Tobacco Never Used     Allergies  Allergen Reactions  . Sulfonamide Derivatives     Childhood allergy  . Sulfur Other (See Comments)    Unsure when pt has surgery they have that listed  . Sulfamethoxazole Rash   Objective:  There were no vitals filed for this visit. There is no height or weight on file to calculate BMI. Constitutional Well developed. Well nourished.  Vascular Dorsalis pedis pulses palpable bilaterally. Posterior tibial pulses palpable bilaterally. Capillary refill normal to all digits.  No cyanosis or clubbing noted. Pedal hair growth normal.  Neurologic Normal speech. Oriented to person, place, and time. Epicritic sensation to light touch grossly present bilaterally.  Dermatologic  hyperkeratotic skin lesion noted to the right plantar IPJ of the hallux with central white nucleated core.  No pinpoint bleeding noted.  No ulceration noted.  Left second and third interdigital space heloma molle/hyperkeratotic lesion noted pain on palpation  Orthopedic: Normal joint ROM without pain or crepitus bilaterally. No visible deformities. No bony tenderness.   Radiographs: None Assessment:   No diagnosis found. Plan:  Patient was evaluated and treated and all questions answered.  Right plantar hallux IPJ porokeratosis/benign skin lesion -I explained to the patient the etiology of porokeratosis and various treatment options were extensively discussed with the patient.  Given that he is having excruciating pain I believe patient will  benefit from aggressive debridement of the lesion with application of Cantharone therapy to help destroy the lesion.  Patient agrees with the plan would like to proceed with Cantharone application. --Lesion was debrided today without complications. Hemostasis was achieved and the area was cleaned. Cantharone was applied followed by an occlusive bandage. Post procedure complications were discussed. Monitor for signs or symptoms of infection and directed to call the  office mainly should any occur.  Heloma molle left second and third -I explained to patient the etiology of heloma molle and various treatment options were discussed.  I believe patient will benefit from aggressive debridement as well as toe spacer to help keep them separated.  The underlying deformity has to do with the second and third digit that is causing a lot of pressure in between the toes from excessive tight shoes.  I discussed with the patient who states understanding.  No follow-ups on file.

## 2020-01-03 ENCOUNTER — Ambulatory Visit: Payer: Medicare Other | Admitting: Podiatry

## 2021-08-06 ENCOUNTER — Ambulatory Visit (INDEPENDENT_AMBULATORY_CARE_PROVIDER_SITE_OTHER): Payer: Medicare Other | Admitting: Podiatry

## 2021-08-06 ENCOUNTER — Ambulatory Visit (INDEPENDENT_AMBULATORY_CARE_PROVIDER_SITE_OTHER): Payer: Medicare Other

## 2021-08-06 DIAGNOSIS — M2041 Other hammer toe(s) (acquired), right foot: Secondary | ICD-10-CM

## 2021-08-06 DIAGNOSIS — Q828 Other specified congenital malformations of skin: Secondary | ICD-10-CM

## 2021-08-06 DIAGNOSIS — M7671 Peroneal tendinitis, right leg: Secondary | ICD-10-CM

## 2021-08-06 MED ORDER — TRIAMCINOLONE ACETONIDE 10 MG/ML IJ SUSP
10.0000 mg | Freq: Once | INTRAMUSCULAR | Status: AC
  Administered 2021-08-06: 10 mg

## 2021-08-07 NOTE — Progress Notes (Signed)
Subjective:   Patient ID: Malik Barker, male   DOB: 41 y.o.   MRN: [id number]   HPI Patient presents stating he has developed discomfort on the bottom of his right foot around the tendon on the outside and also he has digital deformities left with lesion formation that becomes painful but he has questions about   ROS      Objective:  Physical Exam  Neurovascular status unchanged patient with rheumatoid arthritis is developed inflammation around the fifth metatarsal base with fluid buildup around this area localized and also has developed significant pain between the second and third toes left with deformity of the digit with pressure occurring between the 2 toes     Assessment:  Inflammatory peroneal tendinitis right along with 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  For the right I went ahead and I did do a sterile block I did discuss injection explaining chances for rupture patient wants procedure and I did assist sterile prep and then injected the insertion peroneal base of fifth metatarsal slightly distal with 3 mg dexamethasone Kenalog 5 mg Xylocaine.  I then discussed hammertoe and I do think arthroplasty left to be done we will get a hold off currently but that may be necessary and I went ahead and I discussed this but debrided today courtesy with cushioning we will see how this does  X-rays indicate that there is pressure between the second and third digit left foot with keratotic tissue formation which can become painful and on the right I did not know pathology base of fifth metatarsal

## 2022-12-16 ENCOUNTER — Encounter: Payer: Medicare Other | Admitting: Internal Medicine

## 2022-12-16 NOTE — Progress Notes (Deleted)
   Office Visit Note  Patient: Malik Barker             Date of Birth: Jan 11, 1981           MRN: [id number]             PCP: Lurena Nida, MD Referring: Theodosia Paling* Visit Date: 12/16/2022 Occupation: @GUAROCC @  Subjective:  No chief complaint on file.   History of Present Illness: Malik Barker is a 42 y.o. male ***     Activities of Daily Living:  Patient reports morning stiffness for *** {minute/hour:19697}.   Patient {ACTIONS;DENIES/REPORTS:21021675::"Denies"} nocturnal pain.  Difficulty dressing/grooming: {ACTIONS;DENIES/REPORTS:21021675::"Denies"} Difficulty climbing stairs: {ACTIONS;DENIES/REPORTS:21021675::"Denies"} Difficulty getting out of chair: {ACTIONS;DENIES/REPORTS:21021675::"Denies"} Difficulty using hands for taps, buttons, cutlery, and/or writing: {ACTIONS;DENIES/REPORTS:21021675::"Denies"}  No Rheumatology ROS completed.   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 Past Surgical History:  Procedure Laterality Date   TOTAL HIP ARTHROPLASTY  2001   right   Social History   Social History Narrative   Not on file    There is no immunization history on file for this patient.   Objective: Vital Signs: There were no vitals taken for this visit.   Physical Exam   Musculoskeletal Exam: ***  CDAI Exam: CDAI Score: -- Patient Global: --; Provider Global: -- Swollen: --; Tender: -- Joint Exam 12/16/2022   No joint exam has been documented for this visit   There is currently no information documented on the homunculus. Go to the Rheumatology activity and complete the homunculus joint  exam.  Investigation: No additional findings.  Imaging: No results found.  Recent Labs: No results found for: "WBC", "HGB", "PLT", "NA", "K", "CL", "CO2", "GLUCOSE", "BUN", "CREATININE", "BILITOT", "ALKPHOS", "AST", "ALT", "PROT", "ALBUMIN", "CALCIUM", "GFRAA", "QFTBGOLD", "QFTBGOLDPLUS"  Speciality Comments: No specialty comments available.  Procedures:  No procedures performed Allergies: Elemental sulfur, Sulfonamide derivatives, and Sulfamethoxazole   Assessment / Plan:     Visit Diagnoses: No diagnosis found.  Orders: No orders of the defined types were placed in this encounter.  No orders of the defined types were placed in this encounter.   Face-to-face time spent with patient was *** minutes. Greater than 50% of time was spent in counseling and coordination of care.  Follow-Up Instructions: No follow-ups on file.   Fuller Plan, MD  Note - This record has been created using [employer].  Chart creation errors have been sought, but may not always  have been located. Such creation errors do not reflect on  the standard of medical care.

## 2023-12-21 ENCOUNTER — Ambulatory Visit (HOSPITAL_BASED_OUTPATIENT_CLINIC_OR_DEPARTMENT_OTHER): Admitting: Pulmonary Disease
